# Patient Record
Sex: Female | Born: 1975
Health system: Southern US, Community
[De-identification: ages and names within clinical notes are randomized; demographics above are authoritative.]

## PROBLEM LIST (undated history)

## (undated) DIAGNOSIS — Q211 Atrial septal defect, unspecified: Secondary | ICD-10-CM

## (undated) DIAGNOSIS — F329 Major depressive disorder, single episode, unspecified: Secondary | ICD-10-CM

## (undated) DIAGNOSIS — D219 Benign neoplasm of connective and other soft tissue, unspecified: Secondary | ICD-10-CM

## (undated) DIAGNOSIS — E7800 Pure hypercholesterolemia, unspecified: Secondary | ICD-10-CM

## (undated) DIAGNOSIS — F419 Anxiety disorder, unspecified: Secondary | ICD-10-CM

## (undated) DIAGNOSIS — F32A Depression, unspecified: Secondary | ICD-10-CM

## (undated) DIAGNOSIS — G43109 Migraine with aura, not intractable, without status migrainosus: Secondary | ICD-10-CM

## (undated) DIAGNOSIS — G459 Transient cerebral ischemic attack, unspecified: Secondary | ICD-10-CM

## (undated) HISTORY — DX: Migraine with aura, not intractable, without status migrainosus: G43.109

## (undated) HISTORY — DX: Anxiety disorder, unspecified: F41.9

## (undated) HISTORY — PX: WISDOM TOOTH EXTRACTION: SHX21

## (undated) HISTORY — DX: Atrial septal defect: Q21.1

## (undated) HISTORY — DX: Transient cerebral ischemic attack, unspecified: G45.9

## (undated) HISTORY — DX: Atrial septal defect, unspecified: Q21.10

## (undated) HISTORY — PX: ASD REPAIR: SHX258

---
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
## 1999-04-02 HISTORY — PX: OTHER SURGICAL HISTORY: SHX169

## 2001-07-13 ENCOUNTER — Other Ambulatory Visit: Admission: RE | Admit: 2001-07-13 | Discharge: 2001-07-13 | Payer: Self-pay | Admitting: Obstetrics & Gynecology

## 2002-08-23 ENCOUNTER — Other Ambulatory Visit: Admission: RE | Admit: 2002-08-23 | Discharge: 2002-08-23 | Payer: Self-pay | Admitting: Obstetrics & Gynecology

## 2008-04-01 HISTORY — PX: OTHER SURGICAL HISTORY: SHX169

## 2008-06-30 DIAGNOSIS — G459 Transient cerebral ischemic attack, unspecified: Secondary | ICD-10-CM

## 2008-06-30 HISTORY — DX: Transient cerebral ischemic attack, unspecified: G45.9

## 2009-06-02 ENCOUNTER — Emergency Department (HOSPITAL_COMMUNITY): Admission: EM | Admit: 2009-06-02 | Discharge: 2009-06-03 | Payer: Self-pay | Admitting: Emergency Medicine

## 2011-01-21 ENCOUNTER — Ambulatory Visit (INDEPENDENT_AMBULATORY_CARE_PROVIDER_SITE_OTHER): Payer: BC Managed Care – PPO

## 2011-01-21 ENCOUNTER — Inpatient Hospital Stay (INDEPENDENT_AMBULATORY_CARE_PROVIDER_SITE_OTHER)
Admission: RE | Admit: 2011-01-21 | Discharge: 2011-01-21 | Disposition: A | Discharge status: Home / Self Care | Payer: BC Managed Care – PPO | Source: Ambulatory Visit | Attending: Emergency Medicine | Admitting: Emergency Medicine

## 2011-01-21 DIAGNOSIS — J069 Acute upper respiratory infection, unspecified: Secondary | ICD-10-CM

## 2014-04-01 HISTORY — PX: OTHER SURGICAL HISTORY: SHX169

## 2014-05-31 ENCOUNTER — Encounter (HOSPITAL_COMMUNITY): Payer: Self-pay | Admitting: Emergency Medicine

## 2014-05-31 ENCOUNTER — Emergency Department (HOSPITAL_COMMUNITY)
Admission: EM | Admit: 2014-05-31 | Discharge: 2014-05-31 | Disposition: A | Discharge status: Home / Self Care | Payer: BLUE CROSS/BLUE SHIELD | Attending: Emergency Medicine | Admitting: Emergency Medicine

## 2014-05-31 ENCOUNTER — Emergency Department (HOSPITAL_COMMUNITY): Payer: BLUE CROSS/BLUE SHIELD

## 2014-05-31 DIAGNOSIS — R42 Dizziness and giddiness: Secondary | ICD-10-CM | POA: Insufficient documentation

## 2014-05-31 DIAGNOSIS — R079 Chest pain, unspecified: Secondary | ICD-10-CM

## 2014-05-31 LAB — BASIC METABOLIC PANEL
Anion gap: 10 (ref 5–15)
BUN: 16 mg/dL (ref 6–23)
CO2: 24 mmol/L (ref 19–32)
Calcium: 9.4 mg/dL (ref 8.4–10.5)
Chloride: 104 mmol/L (ref 96–112)
Creatinine, Ser: 0.63 mg/dL (ref 0.50–1.10)
GFR calc Af Amer: >90 mL/min (ref >90)
GFR calc non Af Amer: >90 mL/min (ref >90)
Glucose, Bld: 90 mg/dL (ref 70–99)
Potassium: 4.1 mmol/L (ref 3.5–5.1)
Sodium: 138 mmol/L (ref 135–145)

## 2014-05-31 LAB — CBC
HCT: 40.8 % (ref 36.0–46.0)
Hemoglobin: 13.2 g/dL (ref 12.0–15.0)
MCH: 29.4 pg (ref 26.0–34.0)
MCHC: 32.4 g/dL (ref 30.0–36.0)
MCV: 90.9 fL (ref 78.0–100.0)
Platelets: 169 10*3/uL (ref 150–400)
RBC: 4.49 MIL/uL (ref 3.87–5.11)
RDW: 13.3 % (ref 11.5–15.5)
WBC: 6.7 10*3/uL (ref 4.0–10.5)

## 2014-05-31 LAB — I-STAT TROPONIN, ED: Troponin i, poc: 0 ng/mL (ref 0.00–0.08)

## 2014-05-31 LAB — D-DIMER, QUANTITATIVE: D-Dimer, Quant: 0.41 ug/mL-FEU (ref 0.00–0.48)

## 2014-05-31 NOTE — ED Notes (Addendum)
Pt has a blue top and a gold top draw/held in the main lab.

## 2014-05-31 NOTE — Discharge Instructions (Signed)

## 2014-05-31 NOTE — ED Notes (Addendum)
Pt c/o chest pain left side of sternum that started today. Pt has some lightheadedness.  Pt has PMH of TIA and had Helix device implanted.. Pt took ASA 325mg  this morning.

## 2014-05-31 NOTE — ED Provider Notes (Signed)
CSN: [id number]     Arrival date & time 05/31/14  1323 History   First MD Initiated Contact with Patient 05/31/14 1519     Chief Complaint  Patient presents with  . Chest Pain    Patient is a 39 y.o. female presenting with chest pain. The history is provided by the patient. No language interpreter was used.  Chest Pain  Ms. Wolfley presents for the evaluation of chest pain. She reports chest tightness that started at 9:30 this morning. It was constant until 1:00 pm today. The pain was in the central chest and a little bit onto the left side of the chest. There were no alleviating or worsening factors associated with this pain. She did feel a little "fuzzy headed" at the time. She denies any diaphoresis, shortness of breath, cough, abdominal pain, nausea, vomiting, leg swelling or pain. She has a history of TIA and Helex device placement. She's had stripping of varicose veins a year ago.  History reviewed. No pertinent past medical history. Past Surgical History  Procedure Laterality Date  . Tonsillectomy    . Helix device     No family history on file. History  Substance Use Topics  . Smoking status: Never Smoker   . Smokeless tobacco: Not on file  . Alcohol Use: Yes     Comment: occasional   OB History    No data available     Review of Systems  Cardiovascular: Positive for chest pain.  All other systems reviewed and are negative.     Allergies  Review of patient's allergies indicates no known allergies.  Home Medications   Prior to Admission medications   Not on File   BP 118/82 mmHg  Pulse 84  Temp(Src) 98.1 F (36.7 C) (Oral)  Resp 17  Ht 5\' 10"  (1.778 m)  Wt 200 lb (90.719 kg)  BMI 28.70 kg/m2  SpO2 100%  LMP 05/27/2014 Physical Exam  Constitutional: She is oriented to person, place, and time. She appears well-developed and well-nourished.  HENT:  Head: Normocephalic and atraumatic.  Cardiovascular: Normal rate and regular rhythm.   No murmur  heard. Pulmonary/Chest: Effort normal and breath sounds normal. No respiratory distress.  Abdominal: Soft. There is no tenderness. There is no rebound and no guarding.  Musculoskeletal: She exhibits no edema or tenderness.  Neurological: She is alert and oriented to person, place, and time.  Skin: Skin is warm and dry.  Psychiatric: She has a normal mood and affect. Her behavior is normal.  Nursing note and vitals reviewed.   ED Course  Procedures (including critical care time) Labs Review Labs Reviewed  CBC  BASIC METABOLIC PANEL  D-DIMER, QUANTITATIVE  I-STAT [place], ED    Imaging Review Dg Chest 2 View  05/31/2014   CLINICAL DATA:  Central chest pain since 0930. Hypertension. Nonsmoker.  EXAM: CHEST  2 VIEW  COMPARISON:  01/21/2011  FINDINGS: Midline trachea.  Normal heart size and mediastinal contours.  Sharp costophrenic angles.  No pneumothorax.  Clear lungs.  Note is made of a septal occlusion device.  IMPRESSION: No active cardiopulmonary disease.   Electronically Signed   By: Abigail Miyamoto M.D.   On: 05/31/2014 14:48     EKG Interpretation   Date/Time:  Tuesday May 31 2014 13:29:35 EST Ventricular Rate:  83 PR Interval:  163 QRS Duration: 87 QT Interval:  386 QTC Calculation: 453 R Axis:   62 Text Interpretation:  Sinus rhythm Borderline T abnormalities, anterior  leads No prior  for comparison Confirmed by DOCHERTY  MD, Oretta [phone]) on  05/31/2014 1:32:39 PM      MDM   Final diagnoses:  Chest pain, unspecified chest pain type    Patient here for evaluation of chest pain. History and presentation is atypical for ACS. Patient is low-risk for PE and d-dimer is negative, CT is not warranted at this time. Discussed with patient home care for chest pain that is likely related to anxiety. Discussed PCP follow-up as well as return precautions.   Quintella Reichert, MD 05/31/14 [phone]

## 2014-08-09 ENCOUNTER — Encounter: Payer: Self-pay | Admitting: *Deleted

## 2014-08-17 ENCOUNTER — Encounter: Payer: Self-pay | Admitting: Neurology

## 2014-08-17 ENCOUNTER — Ambulatory Visit (INDEPENDENT_AMBULATORY_CARE_PROVIDER_SITE_OTHER): Payer: BLUE CROSS/BLUE SHIELD | Admitting: Neurology

## 2014-08-17 VITALS — BP 106/70 | HR 80 | Resp 20 | Ht 69.5 in | Wt 197.7 lb

## 2014-08-17 DIAGNOSIS — Z9889 Other specified postprocedural states: Secondary | ICD-10-CM

## 2014-08-17 DIAGNOSIS — Z8673 Personal history of transient ischemic attack (TIA), and cerebral infarction without residual deficits: Secondary | ICD-10-CM | POA: Diagnosis not present

## 2014-08-17 DIAGNOSIS — R9082 White matter disease, unspecified: Secondary | ICD-10-CM

## 2014-08-17 DIAGNOSIS — R930 Abnormal findings on diagnostic imaging of skull and head, not elsewhere classified: Secondary | ICD-10-CM

## 2014-08-17 DIAGNOSIS — G43809 Other migraine, not intractable, without status migrainosus: Secondary | ICD-10-CM | POA: Diagnosis not present

## 2014-08-17 DIAGNOSIS — Z8774 Personal history of (corrected) congenital malformations of heart and circulatory system: Secondary | ICD-10-CM | POA: Insufficient documentation

## 2014-08-17 DIAGNOSIS — G43109 Migraine with aura, not intractable, without status migrainosus: Secondary | ICD-10-CM | POA: Insufficient documentation

## 2014-08-17 NOTE — Patient Instructions (Signed)
At this point, I wouldn't make any changes.  It is not unusual for people who had migraines many years ago to develop them again years later.  They can also present differently than their previous migraines.  Your exam is normal and if things are at baseline, I wouldn't make any changes.  The finding on the MRI of the brain is likely not clinically relevant.  Follow up in one year or as needed.

## 2014-08-17 NOTE — Progress Notes (Signed)
NEUROLOGY CONSULTATION NOTE  Angie Martin MRN: [id number] DOB: 02-08-1976  Referring provider: Lester Kinsman, PA Primary care provider: none  Reason for consult:  Ocular migraine and history of TIA  HISTORY OF PRESENT ILLNESS: Angie Martin) Farrington is a 39 year old right-handed woman with anxiety and history of TIA who presents for ocular migraine.  Records, MRIs of brain from 2010 and 2011 and labs reviewed.  She was admitted to [facility] in March 2010 for episode of bilateral vision loss lasting about 15 minutes followed by feeling of disequilibrium and diaphoresis.  There was no associated headache or focal numbness or weakness.  MRI of the brain revealed small subcortical signal abnormality adjacent to the lateral ventricle.  She was found to have an atrial septal defect.  It was closed with helex device in April 2010.  She was told to take ASA 325mg  daily for one year.  She was given a diagnosis of TIA.  Following this event, she began to have episodes of visual disturbance.  These episodes consist of flashing lights and tunnel vision in both eyes.  They last 15-20 minutes.  There is no associated headache.  She had a normal eye exam.  They typically occur once a month, however she had frequent spells over the course of one week about 6 weeks ago.    She does have a history of migraines, consisting of headache, nausea and vomiting, which resolved after high school.  She never had auras before.  Family history on her mother's side include migraine.  Her maternal grandmother had ALS.  Her maternal great-grandmother had Alzheimer's dementia.  Cholesterol 253, HDL 7 and LDL 174.    In the electronic medical records, she had an MRI of the brain on 06/03/09 for evaluation of blurred vision, which showed FLAIR and T2 white matter foci adjacent to the atrial of the lateral ventricles, as would usually be seen with perinatal insult.    PAST MEDICAL HISTORY: Past Medical History  Diagnosis Date  .  TIA (transient ischemic attack)   . Ocular migraine   . ASD (atrial septal defect)   . Anxiety     PAST SURGICAL HISTORY: Past Surgical History  Procedure Laterality Date  . Tonsillectomy    . Helix device    . Vein laser surgery    . Asd repair      MEDICATIONS: Current Outpatient Prescriptions on File Prior to Visit  Medication Sig Dispense Refill  . ALPRAZolam (XANAX) 0.5 MG tablet Take 0.5 mg by mouth daily as needed for anxiety.    Marland Kitchen aspirin EC 325 MG tablet Take 325 mg by mouth once.    Marland Kitchen OVER THE COUNTER MEDICATION Take 1 tablet by mouth daily. Dietary supplement. Not sure of name.     No current facility-administered medications on file prior to visit.    ALLERGIES: No Known Allergies  FAMILY HISTORY: Family History  Problem Relation Age of Onset  . Hypertension Mother   . Coronary artery disease Mother   . Coronary artery disease Maternal Grandmother   . Hypertension Maternal Grandmother   . ALS Maternal Grandmother     SOCIAL HISTORY: History   Social History  . Marital Status: Single    Spouse Name: N/A  . Number of Children: N/A  . Years of Education: N/A   Occupational History  . Not on file.   Social History Main Topics  . Smoking status: Never Smoker   . Smokeless tobacco: Not on file  . Alcohol  Use: 0.0 oz/week    0 Standard drinks or equivalent per week     Comment: occasional  . Drug Use: No  . Sexual Activity:    Partners: Female   Other Topics Concern  . Not on file   Social History Narrative   Single.  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  No children.    REVIEW OF SYSTEMS: Constitutional: No fevers, chills, or sweats, no generalized fatigue, change in appetite Eyes: No visual changes, double vision, eye pain Ear, nose and throat: No hearing loss, ear pain, nasal congestion, sore throat Cardiovascular: No chest pain, palpitations Respiratory:  No shortness of breath at rest or with exertion, wheezes GastrointestinaI: No nausea, vomiting,  diarrhea, abdominal pain, fecal incontinence Genitourinary:  No dysuria, urinary retention or frequency Musculoskeletal:  No neck pain, back pain Integumentary: No rash, pruritus, skin lesions Neurological: as above Psychiatric: No depression, insomnia, anxiety Endocrine: No palpitations, fatigue, diaphoresis, mood swings, change in appetite, change in weight, increased thirst Hematologic/Lymphatic:  No anemia, purpura, petechiae. Allergic/Immunologic: no itchy/runny eyes, nasal congestion, recent allergic reactions, rashes  PHYSICAL EXAM: Filed Vitals:   08/17/14 0755  BP: 106/70  Pulse: 80  Resp: 20   General: No acute distress Head:  Normocephalic/atraumatic Eyes:  fundi unremarkable, without vessel changes, exudates, hemorrhages or papilledema. Neck: supple, no paraspinal tenderness, full range of motion Back: No paraspinal tenderness Heart: regular rate and rhythm Lungs: Clear to auscultation bilaterally. Vascular: No carotid bruits. Neurological Exam: Mental status: alert and oriented to person, place, and time, recent and remote memory intact, fund of knowledge intact, attention and concentration intact, speech fluent and not dysarthric, language intact. Cranial nerves: CN I: not tested CN II: pupils equal, round and reactive to light, visual fields intact, fundi unremarkable, without vessel changes, exudates, hemorrhages or papilledema. CN III, IV, VI:  full range of motion, no nystagmus, no ptosis CN V: facial sensation intact CN VII: upper and lower face symmetric CN VIII: hearing intact CN IX, X: gag intact, uvula midline CN XI: sternocleidomastoid and trapezius muscles intact CN XII: tongue midline Bulk & Tone: normal, no fasciculations. Motor:  5/5 throughout Sensation:  Pinprick and vibration intact Deep Tendon Reflexes:  2+ throughout, toes downgoing Finger to nose testing:  No dysmetria Heel to shin:  No dysmetria Gait:  Normal station and stride.  Able to  turn and walk in tandem. Romberg negative.  IMPRESSION: 1.  Ocular migraines.  Since they are not debilitation, without headache and occur only once a month, no management needed. 2.  Abnormal MRI of brain.  White matter abnormality likely not clinically relevant.  As per radiologist, finding is consistent with a perinatal insult.   3.  History of TIA.  This is a diagnosis of exclusion.  If this was a TIA, then she presumably had a embolic event affecting both occipital lobes, which is unusual, especially in absence of other focal symptoms.  This, in fact, may have been migraine as well. 4.  History of atrial septal defect closure  PLAN: No new management or workup warranted at this time.  Follow up in one year or as needed.  Thank you for allowing me to take part in the care of this patient.  Metta Clines, DO  CC:  Lester Kinsman, [place]

## 2015-06-12 ENCOUNTER — Encounter (HOSPITAL_COMMUNITY): Payer: Self-pay | Admitting: Emergency Medicine

## 2015-06-12 ENCOUNTER — Emergency Department (HOSPITAL_COMMUNITY): Payer: BLUE CROSS/BLUE SHIELD

## 2015-06-12 ENCOUNTER — Emergency Department (HOSPITAL_COMMUNITY)
Admission: EM | Admit: 2015-06-12 | Discharge: 2015-06-12 | Discharge status: Against Medical Advice | Payer: BLUE CROSS/BLUE SHIELD | Attending: Emergency Medicine | Admitting: Emergency Medicine

## 2015-06-12 DIAGNOSIS — Z7982 Long term (current) use of aspirin: Secondary | ICD-10-CM | POA: Insufficient documentation

## 2015-06-12 DIAGNOSIS — Z8679 Personal history of other diseases of the circulatory system: Secondary | ICD-10-CM | POA: Diagnosis not present

## 2015-06-12 DIAGNOSIS — Z9889 Other specified postprocedural states: Secondary | ICD-10-CM | POA: Insufficient documentation

## 2015-06-12 DIAGNOSIS — Z8774 Personal history of (corrected) congenital malformations of heart and circulatory system: Secondary | ICD-10-CM | POA: Diagnosis not present

## 2015-06-12 DIAGNOSIS — Z8639 Personal history of other endocrine, nutritional and metabolic disease: Secondary | ICD-10-CM | POA: Diagnosis not present

## 2015-06-12 DIAGNOSIS — Z8673 Personal history of transient ischemic attack (TIA), and cerebral infarction without residual deficits: Secondary | ICD-10-CM | POA: Diagnosis not present

## 2015-06-12 DIAGNOSIS — F419 Anxiety disorder, unspecified: Secondary | ICD-10-CM | POA: Diagnosis not present

## 2015-06-12 DIAGNOSIS — R079 Chest pain, unspecified: Secondary | ICD-10-CM | POA: Diagnosis present

## 2015-06-12 DIAGNOSIS — Z3202 Encounter for pregnancy test, result negative: Secondary | ICD-10-CM | POA: Diagnosis not present

## 2015-06-12 DIAGNOSIS — Z79899 Other long term (current) drug therapy: Secondary | ICD-10-CM | POA: Diagnosis not present

## 2015-06-12 DIAGNOSIS — R0789 Other chest pain: Secondary | ICD-10-CM | POA: Diagnosis not present

## 2015-06-12 HISTORY — DX: Pure hypercholesterolemia, unspecified: E78.00

## 2015-06-12 LAB — D-DIMER, QUANTITATIVE: D-Dimer, Quant: 0.33 ug/mL-FEU (ref 0.00–0.50)

## 2015-06-12 LAB — I-STAT BETA HCG BLOOD, ED (MC, WL, AP ONLY): I-stat hCG, quantitative: <5 m[IU]/mL (ref <5)

## 2015-06-12 LAB — BASIC METABOLIC PANEL
Anion gap: 9 (ref 5–15)
BUN: 13 mg/dL (ref 6–20)
CO2: 24 mmol/L (ref 22–32)
Calcium: 9 mg/dL (ref 8.9–10.3)
Chloride: 106 mmol/L (ref 101–111)
Creatinine, Ser: 0.57 mg/dL (ref 0.44–1.00)
GFR calc Af Amer: >60 mL/min (ref >60)
GFR calc non Af Amer: >60 mL/min (ref >60)
Glucose, Bld: 93 mg/dL (ref 65–99)
Potassium: 3.8 mmol/L (ref 3.5–5.1)
Sodium: 139 mmol/L (ref 135–145)

## 2015-06-12 LAB — I-STAT TROPONIN, ED
Troponin i, poc: 0 ng/mL (ref 0.00–0.08)
Troponin i, poc: 0 ng/mL (ref 0.00–0.08)

## 2015-06-12 LAB — CBC
HCT: 37.6 % (ref 36.0–46.0)
Hemoglobin: 12.1 g/dL (ref 12.0–15.0)
MCH: 29.4 pg (ref 26.0–34.0)
MCHC: 32.2 g/dL (ref 30.0–36.0)
MCV: 91.3 fL (ref 78.0–100.0)
Platelets: 160 10*3/uL (ref 150–400)
RBC: 4.12 MIL/uL (ref 3.87–5.11)
RDW: 13.8 % (ref 11.5–15.5)
WBC: 8.2 10*3/uL (ref 4.0–10.5)

## 2015-06-12 MED ORDER — NITROGLYCERIN 0.4 MG SL SUBL
0.4000 mg | SUBLINGUAL_TABLET | SUBLINGUAL | Status: DC | PRN
  Administered 2015-06-12: 0.4 mg via SUBLINGUAL
  Filled 2015-06-12: qty 1

## 2015-06-12 MED ORDER — SODIUM CHLORIDE 0.9 % IV BOLUS (SEPSIS)
1000.0000 mL | Freq: Once | INTRAVENOUS | Status: AC
Start: 2015-06-12 — End: 2015-06-12
  Administered 2015-06-12: 1000 mL via INTRAVENOUS

## 2015-06-12 NOTE — ED Notes (Signed)
IV team at bedside 

## 2015-06-12 NOTE — ED Notes (Signed)
Unable to get labs time 2 tries

## 2015-06-12 NOTE — Discharge Instructions (Signed)
Start taking a daily low-dose 81mg  aspirin  Your hypercoagulability studies are still pending your primary care physician or OB/GYN will have to follow-up these results. They must call to request results.  Please call cardiology to schedule a stress test. Do not hesitate to return to the emergency room for any new, worsening or concerning symptoms.   Nonspecific Chest Pain  Chest pain can be caused by many different conditions. There is always a chance that your pain could be related to something serious, such as a heart attack or a blood clot in your lungs. Chest pain can also be caused by conditions that are not life-threatening. If you have chest pain, it is very important to follow up with your health care provider. CAUSES  Chest pain can be caused by:  Heartburn.  Pneumonia or bronchitis.  Anxiety or stress.  Inflammation around your heart (pericarditis) or lung (pleuritis or pleurisy).  A blood clot in your lung.  A collapsed lung (pneumothorax). It can develop suddenly on its own (spontaneous pneumothorax) or from trauma to the chest.  Shingles infection (varicella-zoster virus).  Heart attack.  Damage to the bones, muscles, and cartilage that make up your chest wall. This can include:  Bruised bones due to injury.  Strained muscles or cartilage due to frequent or repeated coughing or overwork.  Fracture to one or more ribs.  Sore cartilage due to inflammation (costochondritis). RISK FACTORS  Risk factors for chest pain may include:  Activities that increase your risk for trauma or injury to your chest.  Respiratory infections or conditions that cause frequent coughing.  Medical conditions or overeating that can cause heartburn.  Heart disease or family history of heart disease.  Conditions or health behaviors that increase your risk of developing a blood clot.  Having had chicken pox (varicella zoster). SIGNS AND SYMPTOMS Chest pain can feel  like:  Burning or tingling on the surface of your chest or deep in your chest.  Crushing, pressure, aching, or squeezing pain.  Dull or sharp pain that is worse when you move, cough, or take a deep breath.  Pain that is also felt in your back, neck, shoulder, or arm, or pain that spreads to any of these areas. Your chest pain may come and go, or it may stay constant. DIAGNOSIS Lab tests or other studies may be needed to find the cause of your pain. Your health care provider may have you take a test called an ambulatory ECG (electrocardiogram). An ECG records your heartbeat patterns at the time the test is performed. You may also have other tests, such as:  Transthoracic echocardiogram (TTE). During echocardiography, sound waves are used to create a picture of all of the heart structures and to look at how blood flows through your heart.  Transesophageal echocardiogram (TEE).This is a more advanced imaging test that obtains images from inside your body. It allows your health care provider to see your heart in finer detail.  Cardiac monitoring. This allows your health care provider to monitor your heart rate and rhythm in real time.  Holter monitor. This is a portable device that records your heartbeat and can help to diagnose abnormal heartbeats. It allows your health care provider to track your heart activity for several days, if needed.  Stress tests. These can be done through exercise or by taking medicine that makes your heart beat more quickly.  Blood tests.  Imaging tests. TREATMENT  Your treatment depends on what is causing your chest pain. Treatment may  include:  Medicines. These may include:  Acid blockers for heartburn.  Anti-inflammatory medicine.  Pain medicine for inflammatory conditions.  Antibiotic medicine, if an infection is present.  Medicines to dissolve blood clots.  Medicines to treat coronary artery disease.  Supportive care for conditions that do not  require medicines. This may include:  Resting.  Applying heat or cold packs to injured areas.  Limiting activities until pain decreases. HOME CARE INSTRUCTIONS  If you were prescribed an antibiotic medicine, finish it all even if you start to feel better.  Avoid any activities that bring on chest pain.  Do not use any tobacco products, including cigarettes, chewing tobacco, or electronic cigarettes. If you need help quitting, ask your health care provider.  Do not drink alcohol.  Take medicines only as directed by your health care provider.  Keep all follow-up visits as directed by your health care provider. This is important. This includes any further testing if your chest pain does not go away.  If heartburn is the cause for your chest pain, you may be told to keep your head raised (elevated) while sleeping. This reduces the chance that acid will go from your stomach into your esophagus.  Make lifestyle changes as directed by your health care provider. These may include:  Getting regular exercise. Ask your health care provider to suggest some activities that are safe for you.  Eating a heart-healthy diet. A registered dietitian can help you to learn healthy eating options.  Maintaining a healthy weight.  Managing diabetes, if necessary.  Reducing stress. SEEK MEDICAL CARE IF:  Your chest pain does not go away after treatment.  You have a rash with blisters on your chest.  You have a fever. SEEK IMMEDIATE MEDICAL CARE IF:   Your chest pain is worse.  You have an increasing cough, or you cough up blood.  You have severe abdominal pain.  You have severe weakness.  You faint.  You have chills.  You have sudden, unexplained chest discomfort.  You have sudden, unexplained discomfort in your arms, back, neck, or jaw.  You have shortness of breath at any time.  You suddenly start to sweat, or your skin gets clammy.  You feel nauseous or you vomit.  You  suddenly feel light-headed or dizzy.  Your heart begins to beat quickly, or it feels like it is skipping beats. These symptoms may represent a serious problem that is an emergency. Do not wait to see if the symptoms will go away. Get medical help right away. Call your local emergency services (911 in the [place] Do not drive yourself to the hospital.   This information is not intended to replace advice given to you by your health care provider. Make sure you discuss any questions you have with your health care provider.   Document Released: 12/26/2004 Document Revised: 04/08/2014 Document Reviewed: 10/22/2013 Elsevier Interactive Patient Education [employer].

## 2015-06-12 NOTE — ED Provider Notes (Signed)
CSN: [id number]     Arrival date & time 06/12/15  [id number] History   First MD Initiated Contact with Patient 06/12/15 1148     Chief Complaint  Patient presents with  . Chest Pain     (Consider location/radiation/quality/duration/timing/severity/associated sxs/prior Treatment) HPI   Blood pressure 130/82, pulse 87, temperature 98.1 F (36.7 C), temperature source Oral, resp. rate 14, height 5\' 10"  (1.778 m), weight 95.255 kg, last menstrual period 05/29/2015, SpO2 100 %.  Angie Martin is a 40 y.o. female with past medical history significant for hyperlipidemia, ASD, TIA and anxiety complaining of retrosternal chest pain described as sharp and pressure-like onset 3 days ago radiating to her back, rated at 8 out of 10 no exacerbating or alleviating factors identified. Patient denies shortness of breath, cough above her baseline (she's had a sinus infection last week) fever, chills, history of DVT/PE, recent mobilizations however, patient recently returned from charts and take those, she had a 3 hour flight last week. She denies calf pain, leg swelling. Patient took a full dose aspirin this morning. She hasn't tried any pain medication for chest pain. 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 She has a family history of CAD with mother dying in her sleep at age 41 from massive heart attack. Patient has never been a smoker, she denies diabetes, hypertension. She is not treated for her hyperlipidemia but is trying to control it with diet. She denies suicidal ideation, homicidal ideation, auditory or visual hallucinations, alcohol or drug use. She took a Xanax prior to arrival with some relief. She states that she is having coagulability issues with abnormally heavy menses and irregular menses onset approximately one month ago. She denies syncope, abdominal pain,   Past Medical History  Diagnosis Date  .  TIA (transient ischemic attack)   . Ocular migraine   . ASD (atrial septal defect)   . Anxiety   . High cholesterol    Past Surgical History  Procedure Laterality Date  . Tonsillectomy    . Helix device    . Vein laser surgery    . Asd repair     Family History  Problem Relation Age of Onset  . Hypertension Mother   . Coronary artery disease Mother   . Coronary artery disease Maternal Grandmother   . Hypertension Maternal Grandmother   . ALS Maternal Grandmother    Social History  Substance Use Topics  . Smoking status: Never Smoker   . Smokeless tobacco: None  . Alcohol Use: 0.0 oz/week    0 Standard drinks or equivalent per week     Comment: occasional   OB History    No data available     Review of Systems  10 systems reviewed and found to be negative, except as noted in the HPI.   Allergies  Review of patient's allergies indicates no known allergies.  Home Medications   Prior to Admission medications   Medication Sig Start Date End Date Taking? Authorizing Provider  ALPRAZolam Duanne Moron) 1 MG tablet Take 1 mg by mouth 2 (two) times daily as needed for anxiety.   Yes Historical Provider, MD  aspirin EC 325 MG tablet Take 325 mg by mouth once.   Yes Historical Provider, MD  cetirizine (ZYRTEC) 10 MG tablet Take 10 mg by mouth daily as needed for allergies or rhinitis.   Yes Historical Provider, MD  OVER THE COUNTER  MEDICATION Take 2 capsules by mouth at bedtime as needed (for sleep). *[place] - sleep supplement*   Yes Historical Provider, MD   BP 106/63 mmHg  Pulse 82  Temp(Src) 98.1 F (36.7 C) (Oral)  Resp 16  Ht 5\' 10"  (1.778 m)  Wt 95.255 kg  BMI 30.13 kg/m2  SpO2 96%  LMP 05/29/2015 Physical Exam  Constitutional: She is oriented to person, place, and time. She appears well-developed and well-nourished. No distress.  HENT:  Head: Normocephalic.  Mouth/Throat: Oropharynx is clear and moist.  Eyes: Conjunctivae are normal.  Neck: Normal range of  motion. No JVD present. No tracheal deviation present.  Cardiovascular: Normal rate, regular rhythm and intact distal pulses.   Radial pulse equal bilaterally  Pulmonary/Chest: Effort normal and breath sounds normal. No stridor. No respiratory distress. She has no wheezes. She has no rales. She exhibits no tenderness.  Abdominal: Soft. She exhibits no distension and no mass. There is no tenderness. There is no rebound and no guarding.  Musculoskeletal: Normal range of motion. She exhibits no edema or tenderness.  No calf asymmetry, superficial collaterals, palpable cords, edema, Homans sign negative bilaterally.    Neurological: She is alert and oriented to person, place, and time.  Skin: Skin is warm. She is not diaphoretic.  Psychiatric: She has a normal mood and affect.  Nursing note and vitals reviewed.   ED Course  Procedures (including critical care time) Labs Review Labs Reviewed  BASIC METABOLIC PANEL  CBC  D-DIMER, QUANTITATIVE (NOT AT [facility])  ANTITHROMBIN III  PROTEIN C ACTIVITY  PROTEIN C, TOTAL  PROTEIN S ACTIVITY  PROTEIN S, TOTAL  LUPUS ANTICOAGULANT PANEL  BETA-2-GLYCOPROTEIN I ABS, IGG/M/A  HOMOCYSTEINE  FACTOR 5 LEIDEN  PROTHROMBIN GENE MUTATION  CARDIOLIPIN ANTIBODIES, IGG, IGM, IGA  I-STAT TROPOININ, ED  I-STAT BETA HCG BLOOD, ED ([facility], [facility], [facility] ONLY)  I-STAT TROPOININ, ED    Imaging Review Dg Chest 2 View  06/12/2015  CLINICAL DATA:  Mid chest pain and tightness beginning the morning of 06/10/2015. EXAM: CHEST  2 VIEW COMPARISON:  PA and lateral chest 05/31/2014 and 01/21/2011. FINDINGS: The lungs are clear. Heart size is normal. No pneumothorax or pleural effusion. Septal occlusion device is again seen, unchanged. IMPRESSION: No acute disease. Electronically Signed   By: Inge Rise M.D.   On: 06/12/2015 11:32   I have personally reviewed and evaluated these images and lab results as part of my medical decision-making.   EKG  Interpretation   Date/Time:  Monday June 12 2015 10:22:03 EDT Ventricular Rate:  82 PR Interval:  177 QRS Duration: 99 QT Interval:  395 QTC Calculation: 461 R Axis:   55 Text Interpretation:  Sinus rhythm Anteroseptal infarct, age indeterminate  Baseline wander in lead(s) V6 No significant change since last tracing  Confirmed by ALLEN  MD, ANTHONY ([id number]) on 06/12/2015 2:11:10 PM      MDM   Final diagnoses:  Atypical chest pain   Filed Vitals:   06/12/15 1335 06/12/15 1337 06/12/15 1353 06/12/15 1415  BP: 110/61 110/61 111/59 106/63  Pulse: 90 85 93 82  Temp:      TempSrc:      Resp:  16    Height:      Weight:      SpO2: 100% 100% 99% 96%    Medications  nitroGLYCERIN (NITROSTAT) SL tablet 0.4 mg (0.4 mg Sublingual Given 06/12/15 1349)  sodium chloride 0.9 % bolus 1,000 mL (1,000 mLs Intravenous New Bag/Given 06/12/15 1352)  Angie Martin is 40 y.o. female presenting with Atypical chest pain onset 2 days ago constant, no exacerbating or alleviating factors. EKG with no ischemic changes or arrhythmia. Chest x-ray negative, troponin negative. Physical exam is not consistent with DVT however patient did have a 3 hour flight last week. I think this may be anxiety related however, she is also complaining of abnormally heavy menses with clots. We'll draw hypercoagulability panel and she understands her primary care or OB/GYN must follow this up. We'll also draw d-dimer.  Dimer negative. Will delta troponin her. Given her family history of mother dying at 102 cardiology referral given for possible stress test.  Patient had to leave before second troponin resulted. Delta troponin negative. Asked nurse to call the patient to give cardiology contact information.      Monico Blitz, PA-C 06/12/15 1636  Lacretia Leigh, MD 06/14/15 1436

## 2015-06-12 NOTE — ED Notes (Signed)
Phlebotomy notified to draw labs as pt is difficult stick & per pt request.

## 2015-06-12 NOTE — ED Notes (Signed)
Patient's istat troponin draw and processed at 1625. Angie Martin, [place] notified. Patient apologizes but states she must leave in order to make another appointment. Requests we call her with the results but understands she is leaving AMA.

## 2015-06-12 NOTE — ED Notes (Signed)
Hx of helix device placed in heart in 2010. Two days ago began having central sharp chest pain and back pain to left upper shoulder area, denies SOB, N/V/D. "I might be having a panic attack, I really don't know. I've been having a lot of huge stressors in my life and am feeling kind of overwhelmed."

## 2015-06-13 ENCOUNTER — Other Ambulatory Visit: Payer: Self-pay | Admitting: Family Medicine

## 2015-06-13 ENCOUNTER — Ambulatory Visit
Admission: RE | Admit: 2015-06-13 | Discharge: 2015-06-13 | Disposition: A | Discharge status: Home / Self Care | Payer: BLUE CROSS/BLUE SHIELD | Source: Ambulatory Visit | Attending: Family Medicine | Admitting: Family Medicine

## 2015-06-13 DIAGNOSIS — R0602 Shortness of breath: Secondary | ICD-10-CM

## 2015-06-13 DIAGNOSIS — R079 Chest pain, unspecified: Secondary | ICD-10-CM

## 2015-06-13 LAB — HOMOCYSTEINE: Homocysteine: 9.8 umol/L (ref 0.0–15.0)

## 2015-06-13 MED ORDER — IOPAMIDOL (ISOVUE-370) INJECTION 76%
100.0000 mL | Freq: Once | INTRAVENOUS | Status: AC | PRN
  Administered 2015-06-13: 100 mL via INTRAVENOUS

## 2015-06-14 ENCOUNTER — Telehealth: Payer: Self-pay | Admitting: Internal Medicine

## 2015-06-14 LAB — BETA-2-GLYCOPROTEIN I ABS, IGG/M/A
Beta-2 Glyco I IgG: <9 GPI IgG units (ref 0–20)
Beta-2-Glycoprotein I IgA: <9 GPI IgA units (ref 0–25)
Beta-2-Glycoprotein I IgM: <9 GPI IgM units (ref 0–32)

## 2015-06-14 LAB — LUPUS ANTICOAGULANT PANEL
DRVVT: 41.8 s (ref 0.0–44.0)
PTT Lupus Anticoagulant: 36.4 s (ref 0.0–43.6)

## 2015-06-14 LAB — PROTEIN S, TOTAL: Protein S Ag, Total: 125 % (ref 60–150)

## 2015-06-14 LAB — PROTEIN C ACTIVITY: Protein C Activity: 137 % (ref 73–180)

## 2015-06-14 LAB — PROTEIN S ACTIVITY: Protein S Activity: 102 % (ref 63–140)

## 2015-06-14 LAB — PROTEIN C, TOTAL: Protein C, Total: 99 % (ref 60–150)

## 2015-06-14 LAB — ANTITHROMBIN III: AntiThromb III Func: 108 % (ref 75–120)

## 2015-06-14 NOTE — Telephone Encounter (Signed)
Received records from [facility] for appointment on 06/28/15 with Dr Debara Pickett.  Records given to [facility] (medical records) for Dr Lysbeth Penner schedule on 06/28/15. lp

## 2015-06-15 ENCOUNTER — Emergency Department (HOSPITAL_COMMUNITY)
Admission: EM | Admit: 2015-06-15 | Discharge: 2015-06-15 | Disposition: A | Discharge status: Home / Self Care | Payer: BLUE CROSS/BLUE SHIELD | Attending: Emergency Medicine | Admitting: Emergency Medicine

## 2015-06-15 ENCOUNTER — Emergency Department (HOSPITAL_COMMUNITY): Payer: BLUE CROSS/BLUE SHIELD

## 2015-06-15 ENCOUNTER — Encounter (HOSPITAL_COMMUNITY): Payer: Self-pay

## 2015-06-15 DIAGNOSIS — Z7982 Long term (current) use of aspirin: Secondary | ICD-10-CM | POA: Diagnosis not present

## 2015-06-15 DIAGNOSIS — Z8639 Personal history of other endocrine, nutritional and metabolic disease: Secondary | ICD-10-CM | POA: Diagnosis not present

## 2015-06-15 DIAGNOSIS — Z8673 Personal history of transient ischemic attack (TIA), and cerebral infarction without residual deficits: Secondary | ICD-10-CM | POA: Diagnosis not present

## 2015-06-15 DIAGNOSIS — Z8774 Personal history of (corrected) congenital malformations of heart and circulatory system: Secondary | ICD-10-CM | POA: Insufficient documentation

## 2015-06-15 DIAGNOSIS — F419 Anxiety disorder, unspecified: Secondary | ICD-10-CM | POA: Insufficient documentation

## 2015-06-15 DIAGNOSIS — R079 Chest pain, unspecified: Secondary | ICD-10-CM | POA: Diagnosis present

## 2015-06-15 DIAGNOSIS — R1013 Epigastric pain: Secondary | ICD-10-CM | POA: Insufficient documentation

## 2015-06-15 DIAGNOSIS — Z8679 Personal history of other diseases of the circulatory system: Secondary | ICD-10-CM | POA: Insufficient documentation

## 2015-06-15 DIAGNOSIS — Z79899 Other long term (current) drug therapy: Secondary | ICD-10-CM | POA: Insufficient documentation

## 2015-06-15 LAB — CBC
HCT: 37.1 % (ref 36.0–46.0)
Hemoglobin: 12.4 g/dL (ref 12.0–15.0)
MCH: 29.5 pg (ref 26.0–34.0)
MCHC: 33.4 g/dL (ref 30.0–36.0)
MCV: 88.3 fL (ref 78.0–100.0)
Platelets: 145 10*3/uL — ABNORMAL LOW (ref 150–400)
RBC: 4.2 MIL/uL (ref 3.87–5.11)
RDW: 13.6 % (ref 11.5–15.5)
WBC: 7.5 10*3/uL (ref 4.0–10.5)

## 2015-06-15 LAB — CARDIOLIPIN ANTIBODIES, IGG, IGM, IGA
Anticardiolipin IgA: <9 APL U/mL (ref 0–11)
Anticardiolipin IgG: <9 GPL U/mL (ref 0–14)
Anticardiolipin IgM: <9 MPL U/mL (ref 0–12)

## 2015-06-15 LAB — I-STAT TROPONIN, ED: Troponin i, poc: 0 ng/mL (ref 0.00–0.08)

## 2015-06-15 LAB — BASIC METABOLIC PANEL
Anion gap: 11 (ref 5–15)
BUN: 8 mg/dL (ref 6–20)
CO2: 23 mmol/L (ref 22–32)
Calcium: 9.2 mg/dL (ref 8.9–10.3)
Chloride: 105 mmol/L (ref 101–111)
Creatinine, Ser: 0.66 mg/dL (ref 0.44–1.00)
GFR calc Af Amer: >60 mL/min (ref >60)
GFR calc non Af Amer: >60 mL/min (ref >60)
Glucose, Bld: 112 mg/dL — ABNORMAL HIGH (ref 65–99)
Potassium: 3.7 mmol/L (ref 3.5–5.1)
Sodium: 139 mmol/L (ref 135–145)

## 2015-06-15 LAB — HEPATIC FUNCTION PANEL
ALT: 19 U/L (ref 14–54)
AST: 24 U/L (ref 15–41)
Albumin: 4.2 g/dL (ref 3.5–5.0)
Alkaline Phosphatase: 50 U/L (ref 38–126)
Bilirubin, Direct: <0.1 mg/dL — ABNORMAL LOW (ref 0.1–0.5)
Total Bilirubin: 0.6 mg/dL (ref 0.3–1.2)
Total Protein: 7.5 g/dL (ref 6.5–8.1)

## 2015-06-15 LAB — LIPASE, BLOOD: Lipase: 31 U/L (ref 11–51)

## 2015-06-15 LAB — FACTOR 5 LEIDEN

## 2015-06-15 MED ORDER — GI COCKTAIL ~~LOC~~
30.0000 mL | Freq: Once | ORAL | Status: AC
  Administered 2015-06-15: 30 mL via ORAL
  Filled 2015-06-15: qty 30

## 2015-06-15 MED ORDER — PANTOPRAZOLE SODIUM 20 MG PO TBEC: 20.0000 mg | DELAYED_RELEASE_TABLET | Freq: Two times a day (BID) | ORAL | Status: DC

## 2015-06-15 NOTE — ED Notes (Signed)
MD at bedside. 

## 2015-06-15 NOTE — Discharge Instructions (Signed)
Protonix as prescribed.  I have spoken with the on-call cardiologist. Your information will be forwarded to the [facility] cardiology office to help arrange an expedited appointment.

## 2015-06-15 NOTE — ED Provider Notes (Signed)
CSN: [id number]     Arrival date & time 06/15/15  1255 History   First MD Initiated Contact with Patient 06/15/15 1709     Chief Complaint  Patient presents with  . Chest Pain     (Consider location/radiation/quality/duration/timing/severity/associated sxs/prior Treatment) HPI Comments: Patient is a 40 year old female with past medical history of ASD with helix repair in 2010. She presents today with complaints of chest discomfort that has been going on for the past 6 days. Her pain is constant and is described as a "knife going through to her chest into her back". It is not worsened with breathing, exertion, or palpation. There are no alleviating factors.   She was seen at [facility] 3 days ago and had an EKG and laboratory studies which were reassuring. She was then discharged with follow-up with cardiology. This appointment is not due for another 2 weeks. She also reports seeing her primary Dr. and undergoing a CT Angio of her chest which was negative for pulmonary embolus or other acute pathology.  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  Patient is a 40 y.o. female presenting with chest pain. The history is provided by the patient.  Chest Pain Pain location:  Substernal area Pain quality: stabbing   Pain radiates to:  Does not radiate Pain radiates to the back: yes   Pain severity:  Moderate Onset quality:  Sudden Duration:  6 days Timing:  Constant Chronicity:  New Relieved by:  Nothing Worsened by:  Nothing tried Ineffective treatments:  None tried Associated symptoms: no shortness of breath     Past Medical History  Diagnosis Date  . TIA (transient ischemic attack)   . Ocular migraine   . ASD (atrial septal defect)   . Anxiety   . High cholesterol    Past Surgical History  Procedure Laterality Date  . Tonsillectomy    . Helix device    . Vein laser surgery    . Asd repair     Family History   Problem Relation Age of Onset  . Hypertension Mother   . Coronary artery disease Mother   . Coronary artery disease Maternal Grandmother   . Hypertension Maternal Grandmother   . ALS Maternal Grandmother    Social History  Substance Use Topics  . Smoking status: Never Smoker   . Smokeless tobacco: None  . Alcohol Use: 0.0 oz/week    0 Standard drinks or equivalent per week     Comment: occasional   OB History    No data available     Review of Systems  Respiratory: Negative for shortness of breath.   Cardiovascular: Positive for chest pain.  All other systems reviewed and are negative.     Allergies  Review of patient's allergies indicates no known allergies.  Home Medications   Prior to Admission medications   Medication Sig Start Date End Date Taking? Authorizing Provider  ALPRAZolam Duanne Moron) 1 MG tablet Take 1 mg by mouth 2 (two) times daily as needed for anxiety.   Yes Historical Provider, MD  aspirin EC 325 MG tablet Take 325 mg by mouth daily.    Yes Historical Provider, MD  cetirizine (ZYRTEC) 10 MG tablet Take 10 mg by mouth daily as needed for allergies or rhinitis.   Yes Historical Provider, MD  ibuprofen (ADVIL,MOTRIN) 200 MG tablet Take 800 mg by mouth every 6 (six) hours as needed for mild pain.  Yes Historical Provider, MD  OVER THE COUNTER MEDICATION Take 2 capsules by mouth at bedtime as needed (for sleep). *[place] - sleep supplement*   Yes Historical Provider, MD   BP 116/79 mmHg  Pulse 89  Temp(Src) 98.5 F (36.9 C) (Oral)  Resp 22  Ht 5\' 10"  (1.778 m)  Wt 210 lb (95.255 kg)  BMI 30.13 kg/m2  SpO2 100%  LMP 05/29/2015 Physical Exam  Constitutional: She is oriented to person, place, and time. She appears well-developed and well-nourished. No distress.  HENT:  Head: Normocephalic and atraumatic.  Neck: Normal range of motion. Neck supple.  Cardiovascular: Normal rate and regular rhythm.  Exam reveals no gallop and no friction rub.   No murmur  heard. Pulmonary/Chest: Effort normal and breath sounds normal. No respiratory distress. She has no wheezes.  Abdominal: Soft. Bowel sounds are normal. She exhibits no distension. There is no tenderness.  Musculoskeletal: Normal range of motion. She exhibits no edema or tenderness.  There is no calf tenderness or swelling. Homans sign is absent bilaterally.  Neurological: She is alert and oriented to person, place, and time.  Skin: Skin is warm and dry. She is not diaphoretic.  Nursing note and vitals reviewed.   ED Course  Procedures (including critical care time) Labs Review Labs Reviewed  BASIC METABOLIC PANEL - Abnormal; Notable for the following:    Glucose, Bld 112 (*)    All other components within normal limits  CBC - Abnormal; Notable for the following:    Platelets 145 (*)    All other components within normal limits  HEPATIC FUNCTION PANEL  I-STAT TROPOININ, ED    Imaging Review No results found. I have personally reviewed and evaluated these images and lab results as part of my medical decision-making.   EKG Interpretation   Date/Time:  Thursday June 15 2015 13:00:03 EDT Ventricular Rate:  86 PR Interval:  166 QRS Duration: 78 QT Interval:  362 QTC Calculation: 433 R Axis:   69 Text Interpretation:  Normal sinus rhythm Nonspecific T wave abnormality  Abnormal ECG Confirmed by Shataya Winkles  MD, Delontae Lamm ([id number]) on 06/15/2015 5:15:25  PM      MDM   Final diagnoses:  Epigastric pain    Patient is a 40 year old female with no prior cardiac history who presents with a several day history of sharp pain in the center of her chest. This is her second visit to the ER and she is also seen her primary doctor once for these complaints. She has undergone multiple EKGs, had multiple negative troponins, and a CT scan of the chest which has failed to reveal pulmonary embolism or other acute pathology. I have also obtained an ultrasound this evening which reveals no evidence for  gallstones or other abnormality. Her LFTs and lipase are normal.  She repeatedly references excessive stress in her life related to her new business ventures and I suspect anxiety may play a significant component in her symptoms. She did have some relief with a GI cocktail while in the ER and she will be started on Protonix.  I've spoken with Dr. Rosendo Gros who is on-call for cardiology. He will forward the patient's information to the oncoming cardiology team in the morning so that an expedited appointment can be arranged for Ms. Ravi to be seen sooner than the 2 weeks she was quoted when she called there before.    Veryl Speak, MD 06/15/15 2036

## 2015-06-15 NOTE — ED Notes (Signed)
Patient here with ongoing CP and was seen at [facility] earlier in week. Reports all negative labs, EKG, had negative CT-A yesterday for this ongoing pain. Reports has been taking ibuprofen for same with no relief. Did get relief with SL NTG at [facility]

## 2015-06-15 NOTE — ED Notes (Signed)
Patient verbalized understanding of discharge instructions and denies any further needs or questions at this time. VS stable. Patient ambulatory with steady gait.  

## 2015-06-16 LAB — PROTHROMBIN GENE MUTATION

## 2015-06-28 ENCOUNTER — Encounter: Payer: Self-pay | Admitting: Internal Medicine

## 2015-06-28 ENCOUNTER — Ambulatory Visit (INDEPENDENT_AMBULATORY_CARE_PROVIDER_SITE_OTHER): Payer: BLUE CROSS/BLUE SHIELD | Admitting: Internal Medicine

## 2015-06-28 VITALS — BP 126/80 | HR 80

## 2015-06-28 DIAGNOSIS — R079 Chest pain, unspecified: Secondary | ICD-10-CM | POA: Diagnosis not present

## 2015-06-28 DIAGNOSIS — Z8249 Family history of ischemic heart disease and other diseases of the circulatory system: Secondary | ICD-10-CM

## 2015-06-28 DIAGNOSIS — R9431 Abnormal electrocardiogram [ECG] [EKG]: Secondary | ICD-10-CM

## 2015-06-28 DIAGNOSIS — Z9889 Other specified postprocedural states: Secondary | ICD-10-CM

## 2015-06-28 DIAGNOSIS — Z8673 Personal history of transient ischemic attack (TIA), and cerebral infarction without residual deficits: Secondary | ICD-10-CM

## 2015-06-28 DIAGNOSIS — E785 Hyperlipidemia, unspecified: Secondary | ICD-10-CM | POA: Diagnosis not present

## 2015-06-28 DIAGNOSIS — R072 Precordial pain: Secondary | ICD-10-CM

## 2015-06-28 DIAGNOSIS — Z8774 Personal history of (corrected) congenital malformations of heart and circulatory system: Secondary | ICD-10-CM

## 2015-06-28 NOTE — Patient Instructions (Addendum)
Your physician has requested that you have an exercise tolerance test. For further information please visit [email or web address]. Please also follow instruction sheet, as given. This is done at Dr. Lysbeth Penner office.   Your physician has requested that you have a coronary calcium score - this is done at [address]   Your physician recommends that you schedule a follow-up appointment after your testing.     Exercise Stress Electrocardiogram An exercise stress electrocardiogram is a test that is done to evaluate the blood supply to your heart. This test may also be called exercise stress electrocardiography. The test is done while you are walking on a treadmill. The goal of this test is to raise your heart rate. This test is done to find areas of poor blood flow to the heart by determining the extent of coronary artery disease (CAD).   CAD is defined as narrowing in one or more heart (coronary) arteries of more than 70%. If you have an abnormal test result, this may mean that you are not getting adequate blood flow to your heart during exercise. Additional testing may be needed to understand why your test was abnormal. LET [facility] CARE PROVIDER KNOW ABOUT:   Any allergies you have.  All medicines you are taking, including vitamins, herbs, eye drops, creams, and over-the-counter medicines.  Previous problems you or members of your family have had with the use of anesthetics.  Any blood disorders you have.  Previous surgeries you have had.  Medical conditions you have.  Possibility of pregnancy, if this applies. RISKS AND COMPLICATIONS Generally, this is a safe procedure. However, as with any procedure, complications can occur. Possible complications can include:  Pain or pressure in the following areas:  Chest.  Jaw or neck.  Between your shoulder blades.  Radiating down your left arm.  Dizziness or light-headedness.  Shortness of breath.  Increased or  irregular heartbeats.  Nausea or vomiting.  Heart attack (rare). BEFORE THE PROCEDURE  Avoid all forms of caffeine 24 hours before your test or as directed by your health care provider. This includes coffee, tea (even decaffeinated tea), caffeinated sodas, chocolate, cocoa, and certain pain medicines.  Follow your health care provider's instructions regarding eating and drinking before the test.  Take your medicines as directed at regular times with water unless instructed otherwise. Exceptions may include:  If you have diabetes, ask how you are to take your insulin or pills. It is common to adjust insulin dosing the morning of the test.  If you are taking beta-blocker medicines, it is important to talk to your health care provider about these medicines well before the date of your test. Taking beta-blocker medicines may interfere with the test. In some cases, these medicines need to be changed or stopped 24 hours or more before the test.  If you wear a nitroglycerin patch, it may need to be removed prior to the test. Ask your health care provider if the patch should be removed before the test.  If you use an inhaler for any breathing condition, bring it with you to the test.  If you are an outpatient, bring a snack so you can eat right after the stress phase of the test.  Do not smoke for 4 hours prior to the test or as directed by your health care provider.  Do not apply lotions, powders, creams, or oils on your chest prior to the test.  Wear loose-fitting clothes and comfortable shoes for the test.  This test involves walking on a treadmill. PROCEDURE  Multiple patches (electrodes) will be put on your chest. If needed, small areas of your chest may have to be shaved to get better contact with the electrodes. Once the electrodes are attached to your body, multiple wires will be attached to the electrodes and your heart rate will be monitored.  Your heart will be monitored both at  rest and while exercising.  You will walk on a treadmill. The treadmill will be started at a slow pace. The treadmill speed and incline will gradually be increased to raise your heart rate. AFTER THE PROCEDURE  Your heart rate and blood pressure will be monitored after the test.  You may return to your normal schedule including diet, activities, and medicines, unless your health care provider tells you otherwise.   This information is not intended to replace advice given to you by your health care provider. Make sure you discuss any questions you have with your health care provider.   Document Released: 03/15/2000 Document Revised: 03/23/2013 Document Reviewed: 11/23/2012 Elsevier Interactive Patient Education 2016 Elsevier Inc.  Coronary Calcium Scan A coronary calcium scan is an imaging test used to look for deposits of calcium and other fatty materials (plaques) in the inner lining of the blood vessels of your heart (coronary arteries). These deposits of calcium and plaques can partly clog and narrow the coronary arteries without producing any symptoms or warning signs. This puts you at risk for a heart attack. This test can detect these deposits before symptoms develop.  LET [facility] CARE PROVIDER KNOW ABOUT:  Any allergies you have.  All medicines you are taking, including vitamins, herbs, eye drops, creams, and over-the-counter medicines.  Previous problems you or members of your family have had with the use of anesthetics.  Any blood disorders you have.  Previous surgeries you have had.  Medical conditions you have.  Possibility of pregnancy, if this applies. RISKS AND COMPLICATIONS Generally, this is a safe procedure. However, as with any procedure, complications can occur. This test involves the use of radiation. Radiation exposure can be dangerous to a pregnant woman and her unborn baby. If you are pregnant, you should not have this procedure done.  BEFORE THE  PROCEDURE There is no special preparation for the procedure. PROCEDURE  You will need to undress and put on a hospital gown. You will need to remove any jewelry around your neck or chest.  Sticky electrodes are placed on your chest and are connected to an electrocardiogram (EKG or electrocardiography) machine to recorda tracing of the electrical activity of your heart.  A CT scanner will take pictures of your heart. During this time, you will be asked to lie still and hold your breath for 2-3 seconds while a picture is being taken of your heart. AFTER THE PROCEDURE   You will be allowed to get dressed.  You can return to your normal activities after the scan is done.   This information is not intended to replace advice given to you by your health care provider. Make sure you discuss any questions you have with your health care provider.   Document Released: 09/14/2007 Document Revised: 03/23/2013 Document Reviewed: 11/23/2012 Elsevier Interactive Patient Education [employer].

## 2015-06-28 NOTE — Progress Notes (Signed)
OFFICE NOTE  Chief Complaint:  Chest pain  Primary Care Physician: No PCP Per Patient  HPI:  Angie Martin is a 40 y.o. female who has a history of TIA in the past and was found to have an atrial septal defect. She was referred to [facility] in 2010 and underwent a 30 mm Helix ASD closure. She was followed by Dr. Jenne Pane. She last saw him in 2011 was felt to be doing well and was released from ongoing care. She also has a strong family history of coronary disease and has elevated cholesterol which is trying to manage with diet and exercise. 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 Recently she developed some chest discomfort and was seen by her primary care provider for this. She was advised to go to the emergency department for which she underwent numerous tests including a CT pulmonary angiogram which was negative for PE. Troponins were also negative. It was advised that she establish with a cardiologist for possible additional testing. Since that time she continues to have some mild chest discomfort as well as pain across her back. Seems to be worse when she is busy or under stressful situations. EKG was performed in the office today which shows normal sinus rhythm with poor R-wave progression across the precordium and a rate of 80. Her mother had a heart attack at age 53. She also reports a history of anxiety and takes Xanax "often".  PMHx:  Past Medical History  Diagnosis Date  . TIA (transient ischemic attack)   . Ocular migraine   . ASD (atrial septal defect)   . Anxiety   . High cholesterol     Past Surgical History  Procedure Laterality Date  . Tonsillectomy    . Helix device    . Vein laser surgery    . Asd repair      FAMHx:  Family History  Problem Relation Age of Onset  . Hypertension Mother   . Coronary artery disease Mother   . Coronary artery disease Maternal Grandmother   . Hypertension Maternal Grandmother   . ALS Maternal  Grandmother     SOCHx:   reports that she has never smoked. She does not have any smokeless tobacco history on file. She reports that she drinks alcohol. She reports that she does not use illicit drugs.  ALLERGIES:  No Known Allergies  ROS: Pertinent items noted in HPI and remainder of comprehensive ROS otherwise negative.  HOME MEDS: Current Outpatient Prescriptions  Medication Sig Dispense Refill  . ALPRAZolam (XANAX) 1 MG tablet Take 1 mg by mouth 2 (two) times daily as needed for anxiety.    Marland Kitchen aspirin EC 325 MG tablet Take 325 mg by mouth daily.     Marland Kitchen ibuprofen (ADVIL,MOTRIN) 200 MG tablet Take 800 mg by mouth every 6 (six) hours as needed for mild pain.     Marland Kitchen OVER THE COUNTER MEDICATION Take 2 capsules by mouth at bedtime as needed (for sleep). *[place] - sleep supplement*     No current facility-administered medications for this visit.    LABS/IMAGING: No results found for this or any previous visit (from the past 48 hour(s)). No results found.  WEIGHTS: Wt Readings from Last 3 Encounters:  06/15/15 210 lb (95.255 kg)  06/12/15 210 lb (95.255 kg)  08/17/14 197 lb 11.2 oz (89.676 kg)    VITALS: BP 126/80 mmHg  Pulse 80  LMP 05/29/2015  EXAM: General appearance:  alert and no distress Neck: no carotid bruit and no JVD Lungs: clear to auscultation bilaterally Heart: regular rate and rhythm, S1, S2 normal, no murmur, click, rub or gallop Abdomen: soft, non-tender; bowel sounds normal; no masses,  no organomegaly Extremities: extremities normal, atraumatic, no cyanosis or edema Pulses: 2+ and symmetric Skin: Skin color, texture, turgor normal. No rashes or lesions Neurologic: Grossly normal Psych: Mildly anxious  EKG: Normal sinus rhythm at 80, poor R-wave progression anteriorly  ASSESSMENT: 1. Chest pain with mild EKG abnormalities 2. History of ASD status post 30 mm Helix closure 3. History of TIA, possibly related to her atrial septal  defect 4. Dyslipidemia 5. Family history of premature coronary disease  PLAN: 1.   Ms. Tessler is describing chest pain which is somewhat atypical. It's sharp and does not radiate. It tends to be worse under stressful situations and not necessarily with exertion or relieved by rest. There is some associated back discomfort. She is quite young for coronary disease and would be considered early onset. There was coronary disease in her mother at an early age. Although she has a history of congenital heart disease it does not necessarily mean she is at increased risk for developing new coronary artery disease. That being said, she is called to place starting a new exercise program and wants to work on dietary changes and weight loss to help with her cholesterol. I think prior to that would be reasonable to get an exercise treadmill stress test to rule out ischemia and gauge her exercise ability. In addition, I offered her a coronary artery calcium score. This may be helpful to determine whether she has any evidence of premature coronary artery disease and could help better risk stratify her as well as may be information that would be helpful in determining whether she should have more aggressive treatment of her cholesterol. Her most recent LDL was 166 with a total cholesterol 236 and her cholesterol/HDL ratio was high at 4.7. Plan to see her back to discuss the results of the studies in a few weeks.  Thanks again for the kind referral.  Pixie Casino, MD, [facility] Attending Cardiologist [place] 06/28/2015, 12:47 PM

## 2015-07-07 ENCOUNTER — Ambulatory Visit (INDEPENDENT_AMBULATORY_CARE_PROVIDER_SITE_OTHER)
Admission: RE | Admit: 2015-07-07 | Discharge: 2015-07-07 | Disposition: A | Discharge status: Home / Self Care | Payer: Self-pay | Source: Ambulatory Visit | Attending: Internal Medicine | Admitting: Internal Medicine

## 2015-07-07 ENCOUNTER — Ambulatory Visit (INDEPENDENT_AMBULATORY_CARE_PROVIDER_SITE_OTHER): Payer: BLUE CROSS/BLUE SHIELD

## 2015-07-07 DIAGNOSIS — E785 Hyperlipidemia, unspecified: Secondary | ICD-10-CM

## 2015-07-07 DIAGNOSIS — Z8249 Family history of ischemic heart disease and other diseases of the circulatory system: Secondary | ICD-10-CM

## 2015-07-07 DIAGNOSIS — R079 Chest pain, unspecified: Secondary | ICD-10-CM

## 2015-07-07 DIAGNOSIS — R9431 Abnormal electrocardiogram [ECG] [EKG]: Secondary | ICD-10-CM

## 2015-07-07 LAB — EXERCISE TOLERANCE TEST
Estimated workload: 10.9 METS
Exercise duration (min): 9 min
Exercise duration (sec): 30 s
MPHR: 179 {beats}/min
Peak HR: 173 {beats}/min
Percent HR: 96 %
RPE: 17
Rest HR: 86 {beats}/min

## 2015-07-17 ENCOUNTER — Ambulatory Visit: Payer: BLUE CROSS/BLUE SHIELD | Admitting: Internal Medicine

## 2015-07-18 NOTE — Progress Notes (Signed)
Called patient for PAT appt. In screening office.  Patient may need cardiac clearance - Had a Stress Test on 07/07/15-was normal, hx of TIA at age 40, atrial septal defect but had surgery at [facility] in 2010 for Helix ASD closure.  Cardiologist is Dr Lyman Bishop.  Scheduled PAT on 09/13/15 at 830am.

## 2015-08-17 ENCOUNTER — Ambulatory Visit: Payer: BLUE CROSS/BLUE SHIELD | Admitting: Neurology

## 2015-08-24 ENCOUNTER — Other Ambulatory Visit: Payer: Self-pay | Admitting: Obstetrics and Gynecology

## 2015-09-12 NOTE — Patient Instructions (Addendum)
Your procedure is scheduled on:  Wednesday, June 28  Enter through the [employer] [facility] at:  6AM  Pick up the phone at the desk and dial [phone].  Call this number if you have problems the morning of surgery: [phone].  Remember: Do NOT eat or  Drink after midnight Tuesday, 6/27  Take these medicines the morning of surgery with a SIP OF WATER:  Xanax if needed  Do NOT wear jewelry (body piercing), metal hair clips/bobby pins, make-up, or nail polish. Do NOT wear lotions, powders, or perfumes.  You may wear deoderant. Do NOT shave for 48 hours prior to surgery. Do NOT bring valuables to the hospital.   Leave suitcase in car.  After surgery it may be brought to your room.  For patients admitted to the hospital, checkout time is 11:00 AM the day of discharge. Home with partner Gay Filler cell [phone].

## 2015-09-13 ENCOUNTER — Encounter (HOSPITAL_COMMUNITY)
Admission: RE | Admit: 2015-09-13 | Discharge: 2015-09-13 | Disposition: A | Discharge status: Home / Self Care | Payer: BLUE CROSS/BLUE SHIELD | Source: Ambulatory Visit | Attending: Obstetrics and Gynecology | Admitting: Obstetrics and Gynecology

## 2015-09-13 ENCOUNTER — Encounter (HOSPITAL_COMMUNITY): Payer: Self-pay

## 2015-09-13 VITALS — BP 110/82 | HR 79 | Resp 18 | Ht 68.75 in | Wt 207.5 lb

## 2015-09-13 DIAGNOSIS — Z01812 Encounter for preprocedural laboratory examination: Secondary | ICD-10-CM | POA: Insufficient documentation

## 2015-09-13 DIAGNOSIS — S0502XA Injury of conjunctiva and corneal abrasion without foreign body, left eye, initial encounter: Secondary | ICD-10-CM

## 2015-09-13 HISTORY — DX: Major depressive disorder, single episode, unspecified: F32.9

## 2015-09-13 HISTORY — DX: Benign neoplasm of connective and other soft tissue, unspecified: D21.9

## 2015-09-13 HISTORY — DX: Depression, unspecified: F32.A

## 2015-09-13 LAB — TYPE AND SCREEN
ABO/RH(D): A POS
Antibody Screen: NEGATIVE

## 2015-09-13 LAB — CBC
HCT: 38.6 % (ref 36.0–46.0)
Hemoglobin: 12.7 g/dL (ref 12.0–15.0)
MCH: 27.7 pg (ref 26.0–34.0)
MCHC: 32.9 g/dL (ref 30.0–36.0)
MCV: 84.3 fL (ref 78.0–100.0)
Platelets: 137 10*3/uL — ABNORMAL LOW (ref 150–400)
RBC: 4.58 MIL/uL (ref 3.87–5.11)
RDW: 14.2 % (ref 11.5–15.5)
WBC: 6.6 10*3/uL (ref 4.0–10.5)

## 2015-09-13 LAB — ABO/RH: ABO/RH(D): A POS

## 2015-09-13 NOTE — Pre-Procedure Instructions (Signed)
Called Kati in [place] at ext. [phone]  requesting her to pray with patient prior to going back to surgery on 6/28. (Pager [phone]).  LMOM to put prayer request on calendar.  Note on chart for SS RN to call/page once patient is ready for surgery.

## 2015-09-26 ENCOUNTER — Encounter (HOSPITAL_COMMUNITY): Payer: Self-pay | Admitting: Anesthesiology

## 2015-09-26 NOTE — Anesthesia Preprocedure Evaluation (Addendum)
Anesthesia Evaluation  Patient identified by MRN, date of birth, ID band Patient awake    Reviewed: Allergy & Precautions, H&P , NPO status , Patient's Chart, lab work & pertinent test results  Airway Mallampati: I  TM Distance: >3 FB Neck ROM: full    Dental no notable dental hx. (+) Teeth Intact   Pulmonary neg pulmonary ROS,    Pulmonary exam normal        Cardiovascular negative cardio ROS Normal cardiovascular exam     Neuro/Psych    GI/Hepatic negative GI ROS, Neg liver ROS,   Endo/Other  negative endocrine ROS  Renal/GU negative Renal ROS     Musculoskeletal   Abdominal Normal abdominal exam  (+)   Peds  Hematology negative hematology ROS (+)   Anesthesia Other Findings   Reproductive/Obstetrics negative OB ROS                            Anesthesia Physical Anesthesia Plan  ASA: II  Anesthesia Plan: General   Post-op Pain Management:    Induction: Intravenous  Airway Management Planned: Oral ETT  Additional Equipment:   Intra-op Plan:   Post-operative Plan: Extubation in OR  Informed Consent: I have reviewed the patients History and Physical, chart, labs and discussed the procedure including the risks, benefits and alternatives for the proposed anesthesia with the patient or authorized representative who has indicated his/her understanding and acceptance.     Plan Discussed with: CRNA and Surgeon  Anesthesia Plan Comments:        Anesthesia Quick Evaluation

## 2015-09-27 ENCOUNTER — Encounter (HOSPITAL_COMMUNITY): Payer: Self-pay | Admitting: *Deleted

## 2015-09-27 ENCOUNTER — Ambulatory Visit (HOSPITAL_COMMUNITY): Payer: BLUE CROSS/BLUE SHIELD | Admitting: Anesthesiology

## 2015-09-27 ENCOUNTER — Encounter (HOSPITAL_COMMUNITY)
Admission: RE | Disposition: A | Discharge status: Home / Self Care | Payer: Self-pay | Source: Ambulatory Visit | Attending: Obstetrics and Gynecology

## 2015-09-27 ENCOUNTER — Ambulatory Visit (HOSPITAL_COMMUNITY)
Admission: RE | Admit: 2015-09-27 | Discharge: 2015-09-28 | Disposition: A | Discharge status: Home / Self Care | Payer: BLUE CROSS/BLUE SHIELD | Source: Ambulatory Visit | Attending: Obstetrics and Gynecology | Admitting: Obstetrics and Gynecology

## 2015-09-27 DIAGNOSIS — N920 Excessive and frequent menstruation with regular cycle: Secondary | ICD-10-CM | POA: Insufficient documentation

## 2015-09-27 DIAGNOSIS — T404X5A Adverse effect of other synthetic narcotics, initial encounter: Secondary | ICD-10-CM | POA: Diagnosis not present

## 2015-09-27 DIAGNOSIS — Z8673 Personal history of transient ischemic attack (TIA), and cerebral infarction without residual deficits: Secondary | ICD-10-CM | POA: Insufficient documentation

## 2015-09-27 DIAGNOSIS — D250 Submucous leiomyoma of uterus: Secondary | ICD-10-CM | POA: Insufficient documentation

## 2015-09-27 DIAGNOSIS — N838 Other noninflammatory disorders of ovary, fallopian tube and broad ligament: Secondary | ICD-10-CM | POA: Diagnosis not present

## 2015-09-27 DIAGNOSIS — D252 Subserosal leiomyoma of uterus: Secondary | ICD-10-CM | POA: Insufficient documentation

## 2015-09-27 DIAGNOSIS — Z9889 Other specified postprocedural states: Secondary | ICD-10-CM

## 2015-09-27 DIAGNOSIS — Z8774 Personal history of (corrected) congenital malformations of heart and circulatory system: Secondary | ICD-10-CM | POA: Diagnosis not present

## 2015-09-27 DIAGNOSIS — S0502XA Injury of conjunctiva and corneal abrasion without foreign body, left eye, initial encounter: Secondary | ICD-10-CM | POA: Insufficient documentation

## 2015-09-27 DIAGNOSIS — N83201 Unspecified ovarian cyst, right side: Secondary | ICD-10-CM | POA: Diagnosis not present

## 2015-09-27 HISTORY — PX: CYSTOSCOPY: SHX5120

## 2015-09-27 HISTORY — PX: ROBOTIC ASSISTED TOTAL HYSTERECTOMY: SHX6085

## 2015-09-27 SURGERY — ROBOTIC ASSISTED TOTAL HYSTERECTOMY
Anesthesia: General | Site: Urethra

## 2015-09-27 MED ORDER — STERILE WATER FOR IRRIGATION IR SOLN
Status: DC | PRN
  Administered 2015-09-27: 1000 mL

## 2015-09-27 MED ORDER — ONDANSETRON HCL 4 MG/2ML IJ SOLN
INTRAMUSCULAR | Status: DC | PRN
Start: 2015-09-27 — End: 2015-09-27
  Administered 2015-09-27: 4 mg via INTRAVENOUS

## 2015-09-27 MED ORDER — MIDAZOLAM HCL 5 MG/5ML IJ SOLN
INTRAMUSCULAR | Status: DC | PRN
  Administered 2015-09-27: 2 mg via INTRAVENOUS

## 2015-09-27 MED ORDER — DIPHENHYDRAMINE HCL 50 MG/ML IJ SOLN
50.0000 mg | Freq: Once | INTRAMUSCULAR | Status: AC
  Administered 2015-09-27: 50 mg via INTRAVENOUS

## 2015-09-27 MED ORDER — PROPOFOL 10 MG/ML IV BOLUS
INTRAVENOUS | Status: DC | PRN
  Administered 2015-09-27: 200 mg via INTRAVENOUS

## 2015-09-27 MED ORDER — MENTHOL 3 MG MT LOZG: 1.0000 | LOZENGE | OROMUCOSAL | Status: DC | PRN

## 2015-09-27 MED ORDER — DEXAMETHASONE SODIUM PHOSPHATE 4 MG/ML IJ SOLN
INTRAMUSCULAR | Status: DC | PRN
  Administered 2015-09-27: 4 mg via INTRAVENOUS

## 2015-09-27 MED ORDER — KETOROLAC TROMETHAMINE 0.5 % OP SOLN
1.0000 [drp] | Freq: Once | OPHTHALMIC | Status: AC
  Administered 2015-09-27: 1 [drp] via OPHTHALMIC
  Filled 2015-09-27: qty 3

## 2015-09-27 MED ORDER — ONDANSETRON HCL 4 MG PO TABS: 4.0000 mg | ORAL_TABLET | Freq: Four times a day (QID) | ORAL | Status: DC | PRN

## 2015-09-27 MED ORDER — KETOROLAC TROMETHAMINE 30 MG/ML IJ SOLN
INTRAMUSCULAR | Status: DC | PRN
  Administered 2015-09-27: 30 mg via INTRAVENOUS

## 2015-09-27 MED ORDER — SCOPOLAMINE 1 MG/3DAYS TD PT72
MEDICATED_PATCH | TRANSDERMAL | Status: AC
  Administered 2015-09-27: 1.5 mg via TRANSDERMAL
  Filled 2015-09-27: qty 1

## 2015-09-27 MED ORDER — KETOROLAC TROMETHAMINE 30 MG/ML IJ SOLN
INTRAMUSCULAR | Status: AC
  Filled 2015-09-27: qty 1

## 2015-09-27 MED ORDER — ALPRAZOLAM 0.5 MG PO TABS
1.0000 mg | ORAL_TABLET | Freq: Two times a day (BID) | ORAL | Status: DC | PRN
  Administered 2015-09-27 (×2): 1 mg via ORAL
  Filled 2015-09-27 (×2): qty 2

## 2015-09-27 MED ORDER — HYDROMORPHONE HCL 1 MG/ML IJ SOLN
INTRAMUSCULAR | Status: AC
  Filled 2015-09-27: qty 1

## 2015-09-27 MED ORDER — NALOXONE HCL 0.4 MG/ML IJ SOLN
0.4000 mg | INTRAMUSCULAR | Status: DC | PRN
  Administered 2015-09-27 (×4): 0.4 mg via INTRAVENOUS

## 2015-09-27 MED ORDER — PROPARACAINE HCL 0.5 % OP SOLN
1.0000 [drp] | OPHTHALMIC | Status: DC | PRN
  Administered 2015-09-27 (×3): 1 [drp] via OPHTHALMIC
  Filled 2015-09-27: qty 15

## 2015-09-27 MED ORDER — MIDAZOLAM HCL 2 MG/2ML IJ SOLN
INTRAMUSCULAR | Status: AC
  Filled 2015-09-27: qty 2

## 2015-09-27 MED ORDER — LACTATED RINGERS IR SOLN
Status: DC | PRN
  Administered 2015-09-27: 3000 mL

## 2015-09-27 MED ORDER — DIPHENHYDRAMINE HCL 50 MG/ML IJ SOLN
INTRAMUSCULAR | Status: AC
  Filled 2015-09-27: qty 1

## 2015-09-27 MED ORDER — SUGAMMADEX SODIUM 200 MG/2ML IV SOLN
INTRAVENOUS | Status: DC | PRN
  Administered 2015-09-27: 188.2 mg via INTRAVENOUS

## 2015-09-27 MED ORDER — IBUPROFEN 800 MG PO TABS
800.0000 mg | ORAL_TABLET | Freq: Three times a day (TID) | ORAL | Status: DC | PRN
  Administered 2015-09-27: 800 mg via ORAL
  Filled 2015-09-27: qty 1

## 2015-09-27 MED ORDER — FENTANYL CITRATE (PF) 100 MCG/2ML IJ SOLN
INTRAMUSCULAR | Status: DC | PRN
  Administered 2015-09-27: 100 ug via INTRAVENOUS
  Administered 2015-09-27 (×4): 50 ug via INTRAVENOUS

## 2015-09-27 MED ORDER — OXYCODONE-ACETAMINOPHEN 5-325 MG PO TABS
1.0000 | ORAL_TABLET | ORAL | Status: DC | PRN
  Administered 2015-09-27 (×2): 1 via ORAL
  Filled 2015-09-27 (×2): qty 1

## 2015-09-27 MED ORDER — SODIUM CHLORIDE 0.9 % IV SOLN
INTRAVENOUS | Status: DC | PRN
  Administered 2015-09-27: 120 mL

## 2015-09-27 MED ORDER — SODIUM CHLORIDE 0.9 % IJ SOLN
INTRAMUSCULAR | Status: AC
  Filled 2015-09-27: qty 50

## 2015-09-27 MED ORDER — ONDANSETRON HCL 4 MG/2ML IJ SOLN: 4.0000 mg | Freq: Four times a day (QID) | INTRAMUSCULAR | Status: DC | PRN

## 2015-09-27 MED ORDER — ARTIFICIAL TEARS OP OINT
TOPICAL_OINTMENT | OPHTHALMIC | Status: AC
  Filled 2015-09-27: qty 3.5

## 2015-09-27 MED ORDER — PROPOFOL 10 MG/ML IV BOLUS: INTRAVENOUS | Status: DC | PRN

## 2015-09-27 MED ORDER — SUGAMMADEX SODIUM 200 MG/2ML IV SOLN
INTRAVENOUS | Status: AC
  Filled 2015-09-27: qty 4

## 2015-09-27 MED ORDER — TETRACAINE HCL 0.5 % OP SOLN
1.0000 [drp] | OPHTHALMIC | Status: DC | PRN
  Administered 2015-09-27: 2 [drp] via OPHTHALMIC
  Filled 2015-09-27: qty 2

## 2015-09-27 MED ORDER — FENTANYL CITRATE (PF) 250 MCG/5ML IJ SOLN
INTRAMUSCULAR | Status: AC
  Filled 2015-09-27: qty 5

## 2015-09-27 MED ORDER — LACTATED RINGERS IV SOLN
INTRAVENOUS | Status: DC
  Administered 2015-09-27 (×3): via INTRAVENOUS

## 2015-09-27 MED ORDER — PROMETHAZINE HCL 25 MG/ML IJ SOLN: 6.2500 mg | INTRAMUSCULAR | Status: DC | PRN

## 2015-09-27 MED ORDER — GLYCOPYRROLATE 0.2 MG/ML IJ SOLN
INTRAMUSCULAR | Status: AC
  Filled 2015-09-27: qty 3

## 2015-09-27 MED ORDER — SCOPOLAMINE 1 MG/3DAYS TD PT72
1.0000 | MEDICATED_PATCH | Freq: Once | TRANSDERMAL | Status: DC
  Administered 2015-09-27: 1.5 mg via TRANSDERMAL

## 2015-09-27 MED ORDER — ROCURONIUM BROMIDE 100 MG/10ML IV SOLN
INTRAVENOUS | Status: DC | PRN
  Administered 2015-09-27 (×2): 10 mg via INTRAVENOUS
  Administered 2015-09-27: 40 mg via INTRAVENOUS
  Administered 2015-09-27: 5 mg via INTRAVENOUS

## 2015-09-27 MED ORDER — NALOXONE HCL 0.4 MG/ML IJ SOLN
INTRAMUSCULAR | Status: AC
  Filled 2015-09-27: qty 1

## 2015-09-27 MED ORDER — DEXAMETHASONE SODIUM PHOSPHATE 4 MG/ML IJ SOLN
INTRAMUSCULAR | Status: AC
  Filled 2015-09-27: qty 1

## 2015-09-27 MED ORDER — HYDROMORPHONE HCL 1 MG/ML IJ SOLN
INTRAMUSCULAR | Status: DC | PRN
  Administered 2015-09-27 (×2): 0.5 mg via INTRAVENOUS

## 2015-09-27 MED ORDER — ROPIVACAINE HCL 5 MG/ML IJ SOLN
INTRAMUSCULAR | Status: AC
  Filled 2015-09-27: qty 30

## 2015-09-27 MED ORDER — ONDANSETRON HCL 4 MG/2ML IJ SOLN
INTRAMUSCULAR | Status: AC
  Filled 2015-09-27: qty 2

## 2015-09-27 MED ORDER — MEPERIDINE HCL 25 MG/ML IJ SOLN: 6.2500 mg | INTRAMUSCULAR | Status: DC | PRN

## 2015-09-27 MED ORDER — NEOSTIGMINE METHYLSULFATE 10 MG/10ML IV SOLN
INTRAVENOUS | Status: AC
  Filled 2015-09-27: qty 1

## 2015-09-27 MED ORDER — HYDROMORPHONE HCL 1 MG/ML IJ SOLN: 0.2500 mg | INTRAMUSCULAR | Status: DC | PRN

## 2015-09-27 MED ORDER — LIDOCAINE HCL (CARDIAC) 20 MG/ML IV SOLN
INTRAVENOUS | Status: DC | PRN
  Administered 2015-09-27: 50 mg via INTRAVENOUS

## 2015-09-27 MED ORDER — KETOROLAC TROMETHAMINE 30 MG/ML IJ SOLN: 30.0000 mg | Freq: Four times a day (QID) | INTRAMUSCULAR | Status: DC

## 2015-09-27 MED ORDER — CEFAZOLIN SODIUM-DEXTROSE 2-4 GM/100ML-% IV SOLN
2.0000 g | INTRAVENOUS | Status: AC
  Administered 2015-09-27: 2 g via INTRAVENOUS

## 2015-09-27 MED ORDER — LIDOCAINE HCL (PF) 1 % IJ SOLN
INTRAMUSCULAR | Status: AC
  Filled 2015-09-27: qty 5

## 2015-09-27 MED ORDER — PROPOFOL 10 MG/ML IV BOLUS
INTRAVENOUS | Status: AC
  Filled 2015-09-27: qty 20

## 2015-09-27 MED ORDER — ERYTHROMYCIN 5 MG/GM OP OINT
TOPICAL_OINTMENT | Freq: Four times a day (QID) | OPHTHALMIC | Status: DC
  Administered 2015-09-27: 1 via OPHTHALMIC
  Filled 2015-09-27: qty 3.5

## 2015-09-27 MED ORDER — ARTIFICIAL TEARS OP OINT
TOPICAL_OINTMENT | OPHTHALMIC | Status: DC | PRN
  Administered 2015-09-27: 1 via OPHTHALMIC

## 2015-09-27 MED ORDER — FENTANYL CITRATE (PF) 100 MCG/2ML IJ SOLN
INTRAMUSCULAR | Status: AC
  Filled 2015-09-27: qty 2

## 2015-09-27 MED ORDER — KETOROLAC TROMETHAMINE 30 MG/ML IJ SOLN: 30.0000 mg | Freq: Once | INTRAMUSCULAR | Status: DC

## 2015-09-27 SURGICAL SUPPLY — 64 items
BARRIER ADHS 3X4 INTERCEED (GAUZE/BANDAGES/DRESSINGS) IMPLANT
CLOTH BEACON ORANGE TIMEOUT ST (SAFETY) ×3 IMPLANT
CONT PATH 16OZ SNAP LID 3702 (MISCELLANEOUS) ×3 IMPLANT
COVER BACK TABLE 60X90IN (DRAPES) ×6 IMPLANT
COVER TIP SHEARS 8 DVNC (MISCELLANEOUS) ×2 IMPLANT
COVER TIP SHEARS 8MM DA VINCI (MISCELLANEOUS) ×1
DECANTER SPIKE VIAL GLASS SM (MISCELLANEOUS) IMPLANT
DEFOGGER SCOPE WARMER CLEARIFY (MISCELLANEOUS) ×3 IMPLANT
DURAPREP 26ML APPLICATOR (WOUND CARE) ×3 IMPLANT
ELECT REM PT RETURN 9FT ADLT (ELECTROSURGICAL) ×3
ELECTRODE REM PT RTRN 9FT ADLT (ELECTROSURGICAL) ×2 IMPLANT
GAUZE VASELINE 3X9 (GAUZE/BANDAGES/DRESSINGS) IMPLANT
GLOVE BIO SURGEON STRL SZ 6.5 (GLOVE) ×3 IMPLANT
GLOVE BIO SURGEON STRL SZ7 (GLOVE) ×6 IMPLANT
GLOVE BIOGEL PI IND STRL 6.5 (GLOVE) ×2 IMPLANT
GLOVE BIOGEL PI IND STRL 7.0 (GLOVE) ×6 IMPLANT
GLOVE BIOGEL PI INDICATOR 6.5 (GLOVE) ×1
GLOVE BIOGEL PI INDICATOR 7.0 (GLOVE) ×3
GLOVE ECLIPSE 6.5 STRL STRAW (GLOVE) ×9 IMPLANT
GRASPER BIPOLAR FEN DA VINCI (INSTRUMENTS)
GRASPER BPLR FEN DVNC (INSTRUMENTS) IMPLANT
GYRUS RUMI II 2.5CM BLUE (DISPOSABLE)
GYRUS RUMI II 3.5CM BLUE (DISPOSABLE)
GYRUS RUMI II 4.0CM BLUE (DISPOSABLE)
KIT ACCESSORY DA VINCI DISP (KITS) ×1
KIT ACCESSORY DVNC DISP (KITS) ×2 IMPLANT
LEGGING LITHOTOMY PAIR STRL (DRAPES) ×3 IMPLANT
LIQUID BAND (GAUZE/BANDAGES/DRESSINGS) ×3 IMPLANT
MANIPULATOR UTERINE 4.5 ZUMI (MISCELLANEOUS) IMPLANT
NEEDLE INSUFFLATION 120MM (ENDOMECHANICALS) ×3 IMPLANT
OCCLUDER COLPOPNEUMO (BALLOONS) ×3 IMPLANT
PACK ROBOT WH (CUSTOM PROCEDURE TRAY) ×3 IMPLANT
PACK ROBOTIC GOWN (GOWN DISPOSABLE) ×3 IMPLANT
PAD PREP 24X48 CUFFED NSTRL (MISCELLANEOUS) ×6 IMPLANT
PAD TRENDELENBURG POSITION (MISCELLANEOUS) ×3 IMPLANT
POUCH LAPAROSCOPIC INSTRUMENT (MISCELLANEOUS) ×3 IMPLANT
RUMI II 3.0CM BLUE KOH-EFFICIE (DISPOSABLE) ×3 IMPLANT
RUMI II GYRUS 2.5CM BLUE (DISPOSABLE) IMPLANT
RUMI II GYRUS 3.5CM BLUE (DISPOSABLE) IMPLANT
RUMI II GYRUS 4.0CM BLUE (DISPOSABLE) IMPLANT
SET CYSTO W/LG BORE CLAMP LF (SET/KITS/TRAYS/PACK) IMPLANT
SET IRRIG TUBING LAPAROSCOPIC (IRRIGATION / IRRIGATOR) ×3 IMPLANT
SET TRI-LUMEN FLTR TB AIRSEAL (TUBING) IMPLANT
SUT DVC VLOC 180 0 12IN GS21 (SUTURE)
SUT VIC AB 2-0 CT2 27 (SUTURE) ×6 IMPLANT
SUT VIC AB 2-0 UR6 27 (SUTURE) ×3 IMPLANT
SUT VICRYL RAPIDE 3 0 (SUTURE) ×6 IMPLANT
SUT VLOC 180 0 9IN  GS21 (SUTURE)
SUT VLOC 180 0 9IN GS21 (SUTURE) IMPLANT
SUTURE DVC VLC 180 0 12IN GS21 (SUTURE) IMPLANT
SYR 50ML LL SCALE MARK (SYRINGE) ×3 IMPLANT
SYSTEM CONVERTIBLE TROCAR (TROCAR) IMPLANT
TIP RUMI ORANGE 6.7MMX12CM (TIP) IMPLANT
TIP UTERINE 5.1X6CM LAV DISP (MISCELLANEOUS) IMPLANT
TIP UTERINE 6.7X10CM GRN DISP (MISCELLANEOUS) IMPLANT
TIP UTERINE 6.7X6CM WHT DISP (MISCELLANEOUS) IMPLANT
TIP UTERINE 6.7X8CM BLUE DISP (MISCELLANEOUS) ×3 IMPLANT
TOWEL OR 17X24 6PK STRL BLUE (TOWEL DISPOSABLE) ×6 IMPLANT
TRAY FOLEY CATH SILVER 14FR (SET/KITS/TRAYS/PACK) ×3 IMPLANT
TROCAR DILATING TIP 12MM 150MM (ENDOMECHANICALS) ×3 IMPLANT
TROCAR DISP BLADELESS 8 DVNC (TROCAR) ×2 IMPLANT
TROCAR DISP BLADELESS 8MM (TROCAR) ×1
TROCAR PORT AIRSEAL 5X120 (TROCAR) IMPLANT
WATER STERILE IRR 1000ML POUR (IV SOLUTION) ×9 IMPLANT

## 2015-09-27 NOTE — Op Note (Signed)
09/27/2015  9:43 AM  PATIENT:  Angie Martin  40 y.o. female  PRE-OPERATIVE DIAGNOSIS:  FIBROIDS, menorraghia  POST-OPERATIVE DIAGNOSIS:  FIBROIDS. menorrhagia  PROCEDURE:  Procedure(s): ROBOTIC ASSISTED TOTAL HYSTERECTOMY, Bilateral Salpingectomy (N/A) CYSTOSCOPY  SURGEON:  Surgeon(s) and Role:    * Bobbye Charleston, MD - Primary    * Jerelyn Charles, MD - Assisting  LOCAL MEDICATIONS USED:  BUPIVICAINE   SPECIMEN:  Source of Specimen:  uterus,cervix and tubes  DISPOSITION OF SPECIMEN:  PATHOLOGY  COUNTS:  YES  DICTATION: .Note written in EPIC  PLAN OF CARE: Admit for overnight observation  PATIENT DISPOSITION:  PACU - hemodynamically stable.   Delay start of Pharmacological VTE agent (>24hrs) due to surgical blood loss or risk of bleeding: not applicable  Complications:  None.  Findings:  11 weeks size uterus with a 6 cm anterior submucosal fibroid.  L ovary was normal.  R ovary had a small hemorrhagic cyst.  The ureters were identified during multiple points of the case and were always out of the field of dissection.  On cystoscopy, the bladder was intact and bilateral spill was seen from each ureteral orriface.  There was a small plaque of endometriosis in cul de sac, cauterized with hot shears.  Medications:  Ancef.  Bupivicaine.    Technique:  After adequate anesthesia was achieved the patient was positioned, prepped and draped in usual sterile fashion.  A speculum was placed in the vagina and the cervix dilated with pratt dilators.  The  8 cm Rumi and 3 cm Koh ring were assembled and placed in proper fashion.  The  Speculum was removed and the bladder catheterized with a foley.    Attention was turned to the abdomen where a 1 cm incision was made 1 cm above the umbilicus.  The veress needle was introduced without aspiration of bowel contents or blood and the abdomen insufflated. The long 12 mm trocar was placed and the other three trocar sites were marked out, all  approximately 10 cm from each other and the camera.  Two 8.5 mm trocars were placed on either side of the camera port and a 5 mm assistant port was placed 3 cm above the L the plane of the other two trocarst.  All trocars were inserted under direct visualization of the camera.  The patient was placed in trendelenburg and then the Robot docked.  The PK forceps were placed on arm 2 and the Hot shears on arm 1 and introduced under direct visualization of the camera.  I then broke scrub and sat down at the console.  The above findings were noted and the ureters identified well out of the field of dissection.  The right fallopian tube was isolated and cauterized with the PK.  The Utero-ovarian ligament was then divided with the PK cautery and shears.  The posterior broad ligament was then divided with the hot shears until the uterosacral ligament.  The Broad and cardinal ligaments were then cauterized against the cervix to the level of the Koh ring, securing the uterine artery.  Each pedicle was then incised with the shears.  The anterior leaf was then incised at the reflection of the vessico-uterine junction and the lateral bladder retracted inferiorly after the round ligament had been divided with the PK forceps.  The left tube was cauterized with the PK and divided with the shears;  then the left utero-ovarian ligament divided with the PK forceps and the scissors.  The round ligament was divided as  well and the posterior leaf of the broad ligament then divided with the hot shears. The broad and cardinal ligaments were then cauterized on the left in the same way.   At the level of the internal os, the uterine arteries were bilaterally cauterized with the PK.  The ureters were identified well out of the field of dissection.  .   The bladder was then able to be retracted inferiorly and the vesico-uterine fascia was incised in the midline until the bladder was removed one cm below the Koh ring.  The hot shears then  circumferentially incised the vagina at the level of the reflection on the [facility] ring.  Once the uterus and cervix were amputated, cautery was used to insure hemostasis of the cuff.  Once hemostasis was achieved, the instruments were changed to the mega needle driver and mega suture cut needle driver and the cuff was closed with a running stitches of 0-vicryl V loc.   Cautery was used to ensure hemostasis of the left pedicles very superficially.  The ureters were peristalsing bilaterally well and very lateral to the areas of operation.    The Robot was then undocked and I scrubbed back in.  The needle was removed and Bupivicaine was introduced into the pelvis.  The fascia of the 12 mm trocar was closed with a figure of 8 stitch of 0 vicryl.  The skin incisions were closed with subcuticular stitches of 3-0 vicryl Rapide and Dermabond.  All instruments were removed from the vagina and cystoscopy performed, revealing an intact bladder and vigourous spill of indigo carmine from each ureteral orifice.  The cystoscope was removed and the patient taken to the recovery room in stable condition.  Andreea Arca A

## 2015-09-27 NOTE — Anesthesia Postprocedure Evaluation (Signed)
Anesthesia Post Note  Patient: Control and instrumentation engineer  Procedure(s) Performed: Procedure(s) (LRB): ROBOTIC ASSISTED TOTAL HYSTERECTOMY, Bilateral Salpingectomy (N/A) CYSTOSCOPY  Patient location during evaluation: PACU Anesthesia Type: General Level of consciousness: sedated Pain management: pain level controlled Vital Signs Assessment: post-procedure vital signs reviewed and stable Respiratory status: spontaneous breathing Cardiovascular status: stable Postop Assessment: no signs of nausea or vomiting Anesthetic complications: no     Last Vitals:  Filed Vitals:   09/27/15 1001 09/27/15 1015  BP:  124/79  Pulse: 84   Temp: 36.2 C   Resp:      Last Pain:  Filed Vitals:   09/27/15 1051  PainSc: 0-No pain   Pain Goal: Patients Stated Pain Goal: 3 (09/27/15 1015)               [place]

## 2015-09-27 NOTE — Progress Notes (Signed)
There has been no change in the patients history, status or exam since the history and physical.  Filed Vitals:   09/27/15 0614  BP: 125/91  Pulse: 71  Temp: 97.4 F (36.3 C)  TempSrc: Oral  Resp: 18  SpO2: 100%    Lab Results  Component Value Date   WBC 6.6 09/13/2015   HGB 12.7 09/13/2015   HCT 38.6 09/13/2015   MCV 84.3 09/13/2015   PLT 137* 09/13/2015    Percell Lamboy A

## 2015-09-27 NOTE — Transfer of Care (Signed)
Immediate Anesthesia Transfer of Care Note  Patient: Angie Martin  Procedure(s) Performed: Procedure(s): ROBOTIC ASSISTED TOTAL HYSTERECTOMY, Bilateral Salpingectomy (N/A) CYSTOSCOPY  Patient Location: PACU  Anesthesia Type:General  Level of Consciousness: awake, alert  and oriented  Airway & Oxygen Therapy: Patient Spontanous Breathing and Patient connected to nasal cannula oxygen  Post-op Assessment: Report given to RN and Post -op Vital signs reviewed and stable  Post vital signs: Reviewed and stable  Last Vitals:  Filed Vitals:   09/27/15 0614  BP: 125/91  Pulse: 71  Temp: 36.3 C  Resp: 18    Last Pain: There were no vitals filed for this visit.    Patients Stated Pain Goal: 3 ([id number])  Complications: No apparent anesthesia complications

## 2015-09-27 NOTE — Consult Note (Signed)
Patient Information    Patient Name Sex DOB SSN   Angie Martin Female [birth year] [id number]    Consult Note by Jalene Mullet, MD at 09/13/2015 11:59 PM    Author: Jalene Mullet, MD Service: Ophthalmology Author Type: Physician   Filed: 09/27/2015 8:16 PM Note Time: 09/13/2015 11:59 PM Status: Signed   Editor: Jalene Mullet, MD (Physician)     Expand All Collapse All   Angie Martin 09/27/2015   Ophthalmology Consultation   Reason for consultation: Left eye pain/foreign body sensation  HPI: Immediately after the procedure, the patient began rubbing/itching eyes and developed foreign body sensation in the left eye. She complained that the sensation was like sand in the left eye. She had been uncomfortable until topical anesthetic was applied. She has no other relevant eye history/trauma  Pertinent Medical History:  Active Ambulatory Problems   Diagnosis Date Noted  . History of TIA (transient ischemic attack) 08/17/2014  . Ocular migraine 08/17/2014  . White matter abnormality on MRI of brain 08/17/2014  . History of atrial septal defect repair 08/17/2014  . Chest pain 06/28/2015  . Dyslipidemia 06/28/2015  . Postoperative state 09/27/2015   Resolved Ambulatory Problems   Diagnosis Date Noted  . No Resolved Ambulatory Problems   Past Medical History  Diagnosis Date  . TIA (transient ischemic attack) 06/2008  . ASD (atrial septal defect)   . High cholesterol   . Fibroids   . Anxiety   . Depression     Pertinent Ophthalmic History: None   Current Eye Medications: none   ROS: noncontributory  Visual Fields: FTC OU   Pupils: Pharmacologically dilated at my direction before exam Equal, brisk, no APD   Near acuity: J2    J3  TA:  Normal to palpation   Dilation: Not dilated   External: OD: Normal  OS: Normal   Anterior segment exam: By penlight By slit lap   Conjunctiva: OD: Quiet  OS: 1-2+ injection  Cornea:   OD: Clear, no fluorescein stain   OS: Clear, central epithelial defect radiating superiorly  Anterior Chamber:  OD: Deep/quiet   OS: Deep/quiet   Iris:   OD: Normal   OS: Normal   Lens:   OD: Clear   OS: Clear   Optic disc: Deferred  Central retina--examined with indirect ophthalmoscope: deferred   Impression:  Left corneal abrasion with epithelial defect and mild injection with foreign body sensation  Recommendations/Plan: Erythromycin ointment and application of pressure patch to be kept in place until morning, then Erythromycin ointment QID until follow-up appointment in my clinic on Friday. Patient will call for appointment.  I've discussed these findings with the nurse and/or resident. Please contact our office with any questions or concerns at [phone]. Thank you for calling us to care for this woman .  Royston Cowper

## 2015-09-27 NOTE — Brief Op Note (Signed)
09/27/2015  9:43 AM  PATIENT:  Harvin Hazel  40 y.o. female  PRE-OPERATIVE DIAGNOSIS:  FIBROIDS, menorraghia  POST-OPERATIVE DIAGNOSIS:  FIBROIDS. menorrhagia  PROCEDURE:  Procedure(s): ROBOTIC ASSISTED TOTAL HYSTERECTOMY, Bilateral Salpingectomy (N/A) CYSTOSCOPY  SURGEON:  Surgeon(s) and Role:    * Bobbye Charleston, MD - Primary    * Jerelyn Charles, MD - Assisting  LOCAL MEDICATIONS USED:  BUPIVICAINE   SPECIMEN:  Source of Specimen:  uterus,cervix and tubes  DISPOSITION OF SPECIMEN:  PATHOLOGY  COUNTS:  YES  DICTATION: .Note written in EPIC  PLAN OF CARE: Admit for overnight observation  PATIENT DISPOSITION:  PACU - hemodynamically stable.   Delay start of Pharmacological VTE agent (>24hrs) due to surgical blood loss or risk of bleeding: not applicable  Complications:  None.  Findings:  11 weeks size uterus with a 6 cm anterior submucosal fibroid.  L ovary was normal.  R ovary had a small hemorrhagic cyst.  The ureters were identified during multiple points of the case and were always out of the field of dissection.  On cystoscopy, the bladder was intact and bilateral spill was seen from each ureteral orriface.  There was a small plaque of endometriosis in cul de sac, cauterized with hot shears.  Medications:  Ancef.  Bupivicaine.    Technique:  After adequate anesthesia was achieved the patient was positioned, prepped and draped in usual sterile fashion.  A speculum was placed in the vagina and the cervix dilated with pratt dilators.  The  8 cm Rumi and 3 cm Koh ring were assembled and placed in proper fashion.  The  Speculum was removed and the bladder catheterized with a foley.    Attention was turned to the abdomen where a 1 cm incision was made 1 cm above the umbilicus.  The veress needle was introduced without aspiration of bowel contents or blood and the abdomen insufflated. The long 12 mm trocar was placed and the other three trocar sites were marked out, all  approximately 10 cm from each other and the camera.  Two 8.5 mm trocars were placed on either side of the camera port and a 5 mm assistant port was placed 3 cm above the L the plane of the other two trocarst.  All trocars were inserted under direct visualization of the camera.  The patient was placed in trendelenburg and then the Robot docked.  The PK forceps were placed on arm 2 and the Hot shears on arm 1 and introduced under direct visualization of the camera.  I then broke scrub and sat down at the console.  The above findings were noted and the ureters identified well out of the field of dissection.  The right fallopian tube was isolated and cauterized with the PK.  The Utero-ovarian ligament was then divided with the PK cautery and shears.  The posterior broad ligament was then divided with the hot shears until the uterosacral ligament.  The Broad and cardinal ligaments were then cauterized against the cervix to the level of the Koh ring, securing the uterine artery.  Each pedicle was then incised with the shears.  The anterior leaf was then incised at the reflection of the vessico-uterine junction and the lateral bladder retracted inferiorly after the round ligament had been divided with the PK forceps.  The left tube was cauterized with the PK and divided with the shears;  then the left utero-ovarian ligament divided with the PK forceps and the scissors.  The round ligament was divided as well  and the posterior leaf of the broad ligament then divided with the hot shears. The broad and cardinal ligaments were then cauterized on the left in the same way.   At the level of the internal os, the uterine arteries were bilaterally cauterized with the PK.  The ureters were identified well out of the field of dissection.  .   The bladder was then able to be retracted inferiorly and the vesico-uterine fascia was incised in the midline until the bladder was removed one cm below the Koh ring.  The hot shears then  circumferentially incised the vagina at the level of the reflection on the [facility] ring.  Once the uterus and cervix were amputated, cautery was used to insure hemostasis of the cuff.  Once hemostasis was achieved, the instruments were changed to the mega needle driver and mega suture cut needle driver and the cuff was closed with a running stitches of 0-vicryl V loc.   Cautery was used to ensure hemostasis of the left pedicles very superficially.  The ureters were peristalsing bilaterally well and very lateral to the areas of operation.    The Robot was then undocked and I scrubbed back in.  The needle was removed and Bupivicaine was introduced into the pelvis.  The fascia of the 12 mm trocar was closed with a figure of 8 stitch of 0 vicryl.  The skin incisions were closed with subcuticular stitches of 3-0 vicryl Rapide and Dermabond.  All instruments were removed from the vagina and cystoscopy performed, revealing an intact bladder and vigourous spill of indigo carmine from each ureteral orifice.  The cystoscope was removed and the patient taken to the recovery room in stable condition.  Mysha Peeler A

## 2015-09-27 NOTE — Addendum Note (Signed)
Addendum  created 09/27/15 1122 by Lyn Hollingshead, MD   Modules edited: Orders, PRL Based Order Sets

## 2015-09-27 NOTE — Addendum Note (Signed)
Addendum  created 09/27/15 1220 by Lyn Hollingshead, MD   Modules edited: PRL Based Order Sets

## 2015-09-27 NOTE — Consult Note (Signed)
Shalinda Kuske                                                                               09/27/2015   Ophthalmology Consultation                                         Reason for consultation:  Left eye pain/foreign body sensation  HPI: Immediately after the procedure, the patient began rubbing/itching eyes and developed foreign body sensation in the left eye.  She complained that the sensation was like sand in the left eye.  She had been uncomfortable until topical anesthetic was applied.  She has no other relevant eye history/trauma  Pertinent Medical History:   Active Ambulatory Problems    Diagnosis Date Noted  . History of TIA (transient ischemic attack) 08/17/2014  . Ocular migraine 08/17/2014  . White matter abnormality on MRI of brain 08/17/2014  . History of atrial septal defect repair 08/17/2014  . Chest pain 06/28/2015  . Dyslipidemia 06/28/2015  . Postoperative state 09/27/2015   Resolved Ambulatory Problems    Diagnosis Date Noted  . No Resolved Ambulatory Problems   Past Medical History  Diagnosis Date  . TIA (transient ischemic attack) 06/2008  . ASD (atrial septal defect)   . High cholesterol   . Fibroids   . Anxiety   . Depression     Pertinent Ophthalmic History: None      Current Eye Medications: none   ROS: noncontributory  Visual Fields: FTC OU       Pupils:  Pharmacologically dilated at my direction before exam   Equal, brisk, no APD     Near acuity:     J2        J3  TA:        Normal to palpation   Dilation:  Not dilated   External:   OD:  Normal       OS:  Normal      Anterior segment exam:  By penlight   By slit lap      Conjunctiva:  OD:  Quiet      OS:  1-2+ injection  Cornea:    OD: Clear, no fluorescein stain       OS: Clear, central epithelial defect radiating superiorly  Anterior Chamber:   OD:  Deep/quiet      OS:  Deep/quiet     Iris:    OD:  Normal       OS:  Normal      Lens:    OD:  Clear         OS:   Clear          Optic disc: Deferred  Central retina--examined with indirect ophthalmoscope: deferred   Impression:  Left corneal abrasion with epithelial defect and mild injection with foreign body sensation   Recommendations/Plan:  Erythromycin ointment and application of pressure patch to be kept in place until morning, then Erythromycin ointment QID until follow-up appointment in my clinic on Friday.  Patient will call for appointment.  I've discussed these findings with the  nurse and/or resident. Please contact our office with any questions or concerns at [phone]. Thank you for calling us to care for this woman .  Royston Cowper

## 2015-09-27 NOTE — Anesthesia Procedure Notes (Signed)
Procedure Name: Intubation Date/Time: 09/27/2015 7:30 AM Performed by: Bufford Spikes Pre-anesthesia Checklist: Patient identified, Timeout performed, Emergency Drugs available, Suction available and Patient being monitored Patient Re-evaluated:Patient Re-evaluated prior to inductionOxygen Delivery Method: Circle system utilized Preoxygenation: Pre-oxygenation with 100% oxygen Intubation Type: IV induction Ventilation: Mask ventilation without difficulty Laryngoscope Size: Miller and 2 Grade View: Grade I Tube type: Oral Tube size: 7.0 mm Number of attempts: 1 Airway Equipment and Method: Stylet Placement Confirmation: ETT inserted through vocal cords under direct vision,  breath sounds checked- equal and bilateral and positive ETCO2 Secured at: 21 cm Tube secured with: Tape Dental Injury: Teeth and Oropharynx as per pre-operative assessment

## 2015-09-28 ENCOUNTER — Encounter (HOSPITAL_COMMUNITY): Payer: Self-pay | Admitting: Obstetrics and Gynecology

## 2015-09-28 DIAGNOSIS — D250 Submucous leiomyoma of uterus: Secondary | ICD-10-CM | POA: Diagnosis not present

## 2015-09-28 MED ORDER — ERYTHROMYCIN 5 MG/GM OP OINT
TOPICAL_OINTMENT | Freq: Four times a day (QID) | OPHTHALMIC | Status: DC
Start: 2015-09-28 — End: 2015-11-15

## 2015-09-28 MED ORDER — IBUPROFEN 800 MG PO TABS: 800.0000 mg | ORAL_TABLET | Freq: Three times a day (TID) | ORAL | Status: DC | PRN

## 2015-09-28 MED ORDER — OXYCODONE-ACETAMINOPHEN 5-325 MG PO TABS: 1.0000 | ORAL_TABLET | ORAL | Status: DC | PRN

## 2015-09-28 NOTE — Progress Notes (Signed)
Patient is eating, ambulating, and voiding.  Pain control is good.  BP 103/56 mmHg  Pulse 97  Temp(Src) 98.3 F (36.8 C) (Oral)  Resp 16  Ht 5' 8.75" (1.746 m)  Wt 94.121 kg (207 lb 8 oz)  BMI 30.87 kg/m2  SpO2 97%  LMP 09/12/2015 (Approximate)  lungs:   clear to auscultation cor:    RRR Abdomen:  soft, appropriate tenderness, incisions intact and without erythema or exudate. ex:    no cords   Lab Results  Component Value Date   WBC 6.6 09/13/2015   HGB 12.7 09/13/2015   HCT 38.6 09/13/2015   MCV 84.3 09/13/2015   PLT 137* 09/13/2015    A/P  Routine care.  Expect d/c per plan.  Pt seen by Dr. Posey Pronto of optho- eye is patched and using erythromycin ointment.  She will see him on Friday.   F/u with me in three weeks.  Percocet for pain.

## 2015-09-28 NOTE — Progress Notes (Signed)
Discharge instructions/paperwork reviewed with patient. Patient given prescriptions. Patient has no questions at this time.

## 2015-09-28 NOTE — Anesthesia Postprocedure Evaluation (Signed)
Anesthesia Post Note  Patient: Control and instrumentation engineer  Procedure(s) Performed: Procedure(s) (LRB): ROBOTIC ASSISTED TOTAL HYSTERECTOMY, Bilateral Salpingectomy (N/A) CYSTOSCOPY  Patient location during evaluation: [facility] Anesthesia Type: General Level of consciousness: awake and alert Pain management: pain level controlled Vital Signs Assessment: post-procedure vital signs reviewed and stable Respiratory status: spontaneous breathing Cardiovascular status: stable Postop Assessment: no signs of nausea or vomiting and adequate PO intake Anesthetic complications: no (intense itching of eyes postop causing corneal abrasion;tx by ophtholmologist last night;note made in chart regarding fentanyl for future)     Last Vitals:  Filed Vitals:   09/28/15 0201 09/28/15 0630  BP: 115/73 103/56  Pulse: 91 97  Temp: 36.4 C 36.8 C  Resp: 18 16    Last Pain:  Filed Vitals:   09/28/15 0749  PainSc: 0-No pain   Pain Goal: Patients Stated Pain Goal: 2 (09/28/15 0631)               Everette Rank

## 2015-09-28 NOTE — Discharge Summary (Signed)
Physician Discharge Summary  Patient ID: Angie Martin MRN: [id number] DOB/AGE: 04/26/1975 40 y.o.  Admit date: 09/27/2015 Discharge date: 09/28/2015  Admission Diagnoses:fibroids, menorrhagia  Discharge Diagnoses: same plus corneal abrasion Active Problems:   Postoperative state   Discharged Condition: good  Hospital Course: Uncomplicated robotic TLH/Salpingectomies/cysto.  Postoperatively pt had a severe itching reaction to fentanyl and was uncontrollably rubbing her eyes.  Optho came to see her and she did have a corneal abrasion; recommendations of optho followed.  Pt otherwise recovered from surgery well.   Consults: opthomalogy  Significant Diagnostic Studies: none  Treatments: surgery: Uncomplicated robotic TLH/Salpingectomies/cysto.   Discharge Exam: Blood pressure 103/56, pulse 97, temperature 98.3 F (36.8 C), temperature source Oral, resp. rate 16, height 5' 8.75" (1.746 m), weight 94.121 kg (207 lb 8 oz), last menstrual period 09/12/2015, SpO2 97 %.   Disposition: 01-Home or Self Care  Discharge Instructions    Call MD for:  persistant nausea and vomiting    Complete by:  As directed      Call MD for:  severe uncontrolled pain    Complete by:  As directed      Call MD for:  temperature >100.4    Complete by:  As directed      Diet - low sodium heart healthy    Complete by:  As directed      Discharge instructions    Complete by:  As directed   No driving on narcotics, no sexual activity for 2 weeks.     Increase activity slowly    Complete by:  As directed      May shower / Bathe    Complete by:  As directed   Shower, no bath for 2 weeks.     Remove dressing in 24 hours    Complete by:  As directed      Sexual Activity Restrictions    Complete by:  As directed   No sexual activity for 2 weeks.            Medication List    TAKE these medications        ALPRAZolam 1 MG tablet  Commonly known as:  XANAX  Take 1 mg by mouth 2 (two) times daily as  needed for anxiety.     erythromycin ophthalmic ointment  Place into the left eye 4 (four) times daily.     ibuprofen 800 MG tablet  Commonly known as:  ADVIL,MOTRIN  Take 1 tablet (800 mg total) by mouth every 8 (eight) hours as needed (mild pain).     OVER THE COUNTER MEDICATION  Take 2 capsules by mouth at bedtime as needed (for sleep). *Olley - sleep supplement*     oxyCODONE-acetaminophen 5-325 MG tablet  Commonly known as:  PERCOCET/ROXICET  Take 1-2 tablets by mouth every 4 (four) hours as needed for severe pain (moderate to severe pain (when tolerating fluids)).           Follow-up Information    Follow up with Kayonna Lawniczak A, MD.   Specialty:  Obstetrics and Gynecology   Contact information:   [address] [place] [address] [phone]       Signed: Chanise Habeck A 09/28/2015, 7:23 AM

## 2015-09-28 NOTE — Addendum Note (Signed)
Addendum  created 09/28/15 0806 by Georgeanne Nim, CRNA   Modules edited: Clinical Notes   Clinical Notes:  File: [id number]

## 2015-10-17 NOTE — H&P (Signed)
40 y.o. yo complains of Menorrhagia and bleeding during sex. Also having bladder pressure and frequency. Now a 5.5 cm fibroid seen pushing onto EM; also an area in EM that could be polyp. Had long discussion about options including Lupron, uterine artery embolization, D&C with hysteroscopy (novasure unlikely to help secondary fibroid), total laparoscopic hysterectomy with robot and retention of ovaries. D/w pt all risks and benefits of each. Also d/w pt that of all options, hysterectomy would be the only permanent solution but would likely be longest off work ( up to 6 weeks.)  Pt then underwent sonohystogram to see if she had polyp that could be causing menorrhaghia- results were thickenend endo only.  Pt decided on definitive therapy for menorrhaghia secondary symptomatic fibroids.   .  Past Medical History  Diagnosis Date  . TIA (transient ischemic attack) 06/2008    mild- surgery for ASD closure  . ASD (atrial septal defect)     repaired in 2010 at [facility] - 56mm Helix ASD Closure  . High cholesterol     diet controlled, no meds  . Ocular migraine     otc meds prn  . Fibroids   . 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 Depression     no meds - "eat pot cookies"   Past Surgical History  Procedure Laterality Date  . Tonsillectomy  1993  . Helix device  2010    at [facility] - 30 mm Helix ASD Closure  . Vein laser surgery  2016    bilateral legs  . Asd repair    . Bone spur removal Left 2001  . Wisdom tooth extraction    . Robotic assisted total hysterectomy N/A 09/27/2015    Procedure: ROBOTIC ASSISTED TOTAL HYSTERECTOMY, Bilateral Salpingectomy;  Surgeon: Bobbye Charleston, MD;  Location: [facility];  Service: Gynecology;  Laterality: N/A;  . Cystoscopy  09/27/2015    Procedure: CYSTOSCOPY;  Surgeon: Bobbye Charleston, MD;  Location: [facility];  Service: Gynecology;;    Social History   Social History  . Marital Status: Single    Spouse Name: N/A  . Number of  Children: 0  . Years of Education: college   Occupational History  . restaurantier Other    [employer]   Social History Main Topics  . Smoking status: Never Smoker   . Smokeless tobacco: Never Used  . Alcohol Use: 6.6 oz/week    9 Shots of liquor, 2 Standard drinks or equivalent per week  . Drug Use: Yes    Special: Marijuana     Comment: "eats pot cookies" last used 6/11/7  . Sexual Activity:    Partners: Female    Patent examiner Protection: None     Comment: Scientist, research (life sciences)   Other Topics Concern  . Not on file   Social History Narrative   Single.  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  No children.      epworth sleepiness scale = 4 (06/28/2015)    No current facility-administered medications on file prior to encounter.   Current Outpatient Prescriptions on File Prior to Encounter  Medication Sig Dispense Refill  . ALPRAZolam (XANAX) 1 MG tablet Take 1 mg by mouth 2 (two) times daily as needed for anxiety.    Marland Kitchen OVER THE COUNTER MEDICATION Take 2 capsules by mouth at bedtime as needed (for sleep). *Olley - sleep supplement*      Allergies  Allergen Reactions  . Fentanyl Itching    @  [id number]  Lungs: clear to ascultation Cor:  RRR Abdomen:  soft, nontender, nondistended. Ex:  no cords, erythema Pelvic: Vulva: no masses, no atrophy, no lesions  Vagina: no tenderness, no erythema, no abnormal vaginal discharge, no vesicle(s) or ulcers, no cystocele, no rectocele  Cervix: grossly normal, no discharge, no cervical motion tenderness  Uterus: slightly enlarged, normal shape, midline, no uterine prolapse, non-tender  Bladder/Urethra: normal meatus, no urethral discharge, no urethral mass, bladder non distended, Urethra well supported  Adnexa/Parametria: no parametrial tenderness, no parametrial mass, no adnexal tenderness, no ovarian mass     A:  Menorrhaghia, post SA bleeding  and bladder pressure secondary to symptomatic fibroid.   P: All risks, benefits and alternatives  d/w patient and she desires to proceed.  Patient has undergone a modified bowel prep and will receive preop antibiotics and SCDs during the operation.     Majid Mccravy A

## 2015-10-26 ENCOUNTER — Other Ambulatory Visit (HOSPITAL_COMMUNITY): Payer: Self-pay | Admitting: Obstetrics and Gynecology

## 2015-10-26 DIAGNOSIS — R0789 Other chest pain: Secondary | ICD-10-CM

## 2015-10-30 ENCOUNTER — Ambulatory Visit (HOSPITAL_COMMUNITY)
Admission: RE | Admit: 2015-10-30 | Discharge: 2015-10-30 | Disposition: A | Discharge status: Home / Self Care | Payer: BLUE CROSS/BLUE SHIELD | Source: Ambulatory Visit | Attending: Obstetrics and Gynecology | Admitting: Obstetrics and Gynecology

## 2015-10-30 DIAGNOSIS — R0789 Other chest pain: Secondary | ICD-10-CM | POA: Diagnosis present

## 2015-11-15 ENCOUNTER — Encounter: Payer: Self-pay | Admitting: Neurology

## 2015-11-15 ENCOUNTER — Ambulatory Visit (INDEPENDENT_AMBULATORY_CARE_PROVIDER_SITE_OTHER): Payer: BLUE CROSS/BLUE SHIELD | Admitting: Neurology

## 2015-11-15 VITALS — BP 116/68 | HR 64 | Ht 70.0 in | Wt 202.0 lb

## 2015-11-15 DIAGNOSIS — Z8673 Personal history of transient ischemic attack (TIA), and cerebral infarction without residual deficits: Secondary | ICD-10-CM

## 2015-11-15 DIAGNOSIS — Z9889 Other specified postprocedural states: Secondary | ICD-10-CM

## 2015-11-15 DIAGNOSIS — Z8774 Personal history of (corrected) congenital malformations of heart and circulatory system: Secondary | ICD-10-CM

## 2015-11-15 DIAGNOSIS — G43809 Other migraine, not intractable, without status migrainosus: Secondary | ICD-10-CM | POA: Diagnosis not present

## 2015-11-15 DIAGNOSIS — G43109 Migraine with aura, not intractable, without status migrainosus: Secondary | ICD-10-CM

## 2015-11-15 NOTE — Patient Instructions (Signed)
Follow up as needed

## 2015-11-15 NOTE — Progress Notes (Signed)
NEUROLOGY FOLLOW UP OFFICE NOTE  Angie Martin [id number]  HISTORY OF PRESENT ILLNESS: Angie Martin) Menting is a 40 year old right-handed woman with anxiety and history of TIA who follows up for ocular migraines.    UPDATE: No changes over the past year.  She still has occasional ocular migraines.  She feels well.  HISTORY: She was admitted to [facility] in March 2010 for episode of bilateral vision loss lasting about 15 minutes followed by feeling of disequilibrium and diaphoresis.  There was no associated headache or focal numbness or weakness.  MRI of the brain revealed small subcortical signal abnormality adjacent to the lateral ventricle.  She was found to have an atrial septal defect.  It was closed with helex device in April 2010.  She was told to take ASA 325mg  daily for one year.  She was given a diagnosis of TIA.  Following this event, she began to have episodes of visual disturbance.  These episodes consist of flashing lights and tunnel vision in both eyes.  They last 15-20 minutes.  There is no associated headache.  She had a normal eye exam.  They typically occur once a month, however she had frequent spells over the course of one week about 6 weeks ago.    She does have a history of migraines, consisting of headache, nausea and vomiting, which resolved after high school.  She never had auras before.  Family history on her mother's side include migraine.  Her maternal grandmother had ALS.  Her maternal great-grandmother had Alzheimer's dementia.  Cholesterol 253, HDL 7 and LDL 174.    In the electronic medical records, she had an MRI of the brain on 06/03/09 for evaluation of blurred vision, which showed FLAIR and T2 white matter foci adjacent to the atrial of the lateral ventricles, as would usually be seen with perinatal insult.   PAST MEDICAL HISTORY: Past Medical History:  Diagnosis Date  . 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 ASD (atrial septal  defect)    repaired in 2010 at [facility] - 45mm Helix ASD Closure  . Depression    no meds - "eat pot cookies"  . Fibroids   . High cholesterol    diet controlled, no meds  . Ocular migraine    otc meds prn  . TIA (transient ischemic attack) 06/2008   mild- surgery for ASD closure    MEDICATIONS: Current Outpatient Prescriptions on File Prior to Visit  Medication Sig Dispense Refill  . ALPRAZolam (XANAX) 1 MG tablet Take 1 mg by mouth 2 (two) times daily as needed for anxiety.    Marland Kitchen ibuprofen (ADVIL,MOTRIN) 800 MG tablet Take 1 tablet (800 mg total) by mouth every 8 (eight) hours as needed (mild pain). 30 tablet 0  . OVER THE COUNTER MEDICATION Take 2 capsules by mouth at bedtime as needed (for sleep). *Olley - sleep supplement*     No current facility-administered medications on file prior to visit.     ALLERGIES: Allergies  Allergen Reactions  . Fentanyl Itching    FAMILY HISTORY: Family History  Problem Relation Age of Onset  . Hypertension Mother   . Coronary artery disease Mother     MI  . Coronary artery disease Maternal Grandmother   . Hypertension Maternal Grandmother   . ALS Maternal Grandmother     SOCIAL HISTORY: Social History   Social History  . Marital status: Single    Spouse name: N/A  .  Number of children: 0  . Years of education: college   Occupational History  . restaurantier Other    [employer]   Social History Main Topics  . Smoking status: Never Smoker  . Smokeless tobacco: Never Used  . Alcohol use 6.6 oz/week    9 Shots of liquor, 2 Standard drinks or equivalent per week  . Drug use:     Types: Marijuana     Comment: "eats pot cookies" last used 6/11/7  . Sexual activity: Yes    Partners: Female    Birth control/ protection: None     Comment: Partner Gay Filler   Other Topics Concern  . Not on file   Social History Narrative   Single.  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  No children.      epworth sleepiness scale = 4 (06/28/2015)     REVIEW OF SYSTEMS: Constitutional: No fevers, chills, or sweats, no generalized fatigue, change in appetite Eyes: No visual changes, double vision, eye pain Ear, nose and throat: No hearing loss, ear pain, nasal congestion, sore throat Cardiovascular: No chest pain, palpitations Respiratory:  No shortness of breath at rest or with exertion, wheezes GastrointestinaI: No nausea, vomiting, diarrhea, abdominal pain, fecal incontinence Genitourinary:  No dysuria, urinary retention or frequency Musculoskeletal:  No neck pain, back pain Integumentary: No rash, pruritus, skin lesions Neurological: as above Psychiatric: No depression, insomnia, anxiety Endocrine: No palpitations, fatigue, diaphoresis, mood swings, change in appetite, change in weight, increased thirst Hematologic/Lymphatic:  No purpura, petechiae. Allergic/Immunologic: no itchy/runny eyes, nasal congestion, recent allergic reactions, rashes  PHYSICAL EXAM: Vitals:   11/15/15 0901  BP: 116/68  Pulse: 64   General: No acute distress.  Patient appears well-groomed.  normal body habitus. Head:  Normocephalic/atraumatic Eyes:  Fundi examined but not visualized Neck: supple, no paraspinal tenderness, full range of motion Heart:  Regular rate and rhythm Lungs:  Clear to auscultation bilaterally Back: No paraspinal tenderness Neurological Exam: alert and oriented to person, place, and time. Attention span and concentration intact, recent and remote memory intact, fund of knowledge intact.  Speech fluent and not dysarthric, language intact.  CN II-XII intact. Bulk and tone normal, muscle strength 5/5 throughout.  Sensation to light touch, temperature and vibration intact.  Deep tendon reflexes 2+ throughout, toes downgoing.  Finger to nose and heel to shin testing intact.  Gait normal, Romberg negative.  IMPRESSION: 1.  Ocular migraines.  Since they are not debilitation, without headache and occur only once a month, no management  needed. 2.  Abnormal MRI of brain.  White matter abnormality likely not clinically relevant.  As per radiologist, finding is consistent with a perinatal insult.    3.  History of TIA.  Possibly cardioembolic from her atrial septal defect that traveled to the basilar artery. 4.  History of atrial septal defect closure  PLAN: Stable.  She may follow up as needed.  15 minutes spent face to face with patient, over 50% spent counseling.  Metta Clines, DO  CC:  Donald Prose, MD

## 2015-11-15 NOTE — Progress Notes (Signed)
Chart forwarded.  

## 2017-02-07 ENCOUNTER — Emergency Department (HOSPITAL_COMMUNITY): Payer: BLUE CROSS/BLUE SHIELD

## 2017-02-07 ENCOUNTER — Other Ambulatory Visit: Payer: Self-pay

## 2017-02-07 ENCOUNTER — Emergency Department (HOSPITAL_COMMUNITY)
Admission: EM | Admit: 2017-02-07 | Discharge: 2017-02-07 | Disposition: A | Discharge status: Home / Self Care | Payer: BLUE CROSS/BLUE SHIELD | Attending: Emergency Medicine | Admitting: Emergency Medicine

## 2017-02-07 ENCOUNTER — Encounter (HOSPITAL_COMMUNITY): Payer: Self-pay | Admitting: Emergency Medicine

## 2017-02-07 DIAGNOSIS — R079 Chest pain, unspecified: Secondary | ICD-10-CM

## 2017-02-07 DIAGNOSIS — Z79899 Other long term (current) drug therapy: Secondary | ICD-10-CM | POA: Insufficient documentation

## 2017-02-07 LAB — CBC
HCT: 41.5 % (ref 36.0–46.0)
Hemoglobin: 13.9 g/dL (ref 12.0–15.0)
MCH: 30.1 pg (ref 26.0–34.0)
MCHC: 33.5 g/dL (ref 30.0–36.0)
MCV: 89.8 fL (ref 78.0–100.0)
Platelets: 180 10*3/uL (ref 150–400)
RBC: 4.62 MIL/uL (ref 3.87–5.11)
RDW: 13.2 % (ref 11.5–15.5)
WBC: 8.1 10*3/uL (ref 4.0–10.5)

## 2017-02-07 LAB — BASIC METABOLIC PANEL
Anion gap: 9 (ref 5–15)
BUN: 7 mg/dL (ref 6–20)
CO2: 26 mmol/L (ref 22–32)
Calcium: 9.1 mg/dL (ref 8.9–10.3)
Chloride: 106 mmol/L (ref 101–111)
Creatinine, Ser: 0.63 mg/dL (ref 0.44–1.00)
GFR calc Af Amer: >60 mL/min (ref >60)
GFR calc non Af Amer: >60 mL/min (ref >60)
Glucose, Bld: 101 mg/dL — ABNORMAL HIGH (ref 65–99)
Potassium: 3.6 mmol/L (ref 3.5–5.1)
Sodium: 141 mmol/L (ref 135–145)

## 2017-02-07 LAB — I-STAT TROPONIN, ED
Troponin i, poc: 0 ng/mL (ref 0.00–0.08)
Troponin i, poc: 0 ng/mL (ref 0.00–0.08)

## 2017-02-07 NOTE — ED Notes (Signed)
ED Provider at bedside. 

## 2017-02-07 NOTE — ED Triage Notes (Signed)
Pt verbalizes tingling to extremities for past 2 days; thought was stress related so took anxiety medication. Pt concerned today related to new onset chest pain radiating down left arm; onset just PTA; hx of TIA. Pt appears anxious in triage.

## 2017-02-07 NOTE — ED Provider Notes (Addendum)
[facility] DEPT Provider Note   CSN: [id number] Arrival date & time: 02/07/17  0741     History   Chief Complaint Chief Complaint  Patient presents with  . Chest Pain    HPI Angie Martin is a 41 y.o. female.  Patient is a 41 year old female with a history of TIA status post ASD closure, anxiety, depression, dyslipidemia who is presenting today with sudden onset of chest pain that started approximately 745 this morning and lasted for 30 minutes.  She described it as an intense shooting pain that went into the middle to left side of her chest radiating between her back and down her left arm.  She denies any shortness of breath, diaphoresis or nausea associated with the symptoms.  It resolved spontaneously.  She denies any GERD type symptoms over the last few days but does admit to being severely stressed.  Patient has a history of present anxiety and takes Xanax regularly sometimes 4-5 times per day.  She has been on Xanax for years but states last week she ran out.  She was starting to have tingling of her bilateral hands, tongue, blurry vision, intermittent vomiting and so finally went and got her prescription filled yesterday and after taking the Xanax though symptoms resolved.  However this morning was the first time she had had pain in her chest.  She does have a family history of her mother and grandmother having MIs in their late 67s and early 53s and does not know much about her dad side of the family.  None of her siblings have had heart issues.  She denies alcohol, tobacco or drug use.  She had no abdominal pain today.  No recent travel, OCPs or unilateral leg pain or swelling.   The history is provided by the patient.    Past Medical History:  Diagnosis Date  . 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 ASD (atrial septal defect)    repaired in 2010 at [place] - 21mm Helix ASD Closure  . Depression    no meds - "eat pot  cookies"  . Fibroids   . High cholesterol    diet controlled, no meds  . Ocular migraine    otc meds prn  . TIA (transient ischemic attack) 06/2008   mild- surgery for ASD closure    Patient Active Problem List   Diagnosis Date Noted  . Postoperative state 09/27/2015  . Corneal abrasion, left 09/27/2015  . Chest pain 06/28/2015  . Dyslipidemia 06/28/2015  . History of TIA (transient ischemic attack) 08/17/2014  . Ocular migraine 08/17/2014  . White matter abnormality on MRI of brain 08/17/2014  . History of atrial septal defect repair 08/17/2014    Past Surgical History:  Procedure Laterality Date  . ASD REPAIR    . bone spur removal Left 2001  . Helix device  2010   at [facility] - 30 mm Helix ASD Closure  . TONSILLECTOMY  1993  . vein laser surgery  2016   bilateral legs  . WISDOM TOOTH EXTRACTION      OB History    No data available       Home Medications    Prior to Admission medications   Medication Sig Start Date End Date Taking? Authorizing Provider  ALPRAZolam Duanne Moron) 1 MG tablet Take 1 mg 3 (three) times daily as needed by mouth for anxiety.    Yes [provider]  Nutritional Supplements (JUICE  PLUS FIBRE PO) Take 6 capsules daily by mouth.   Yes [provider]  ibuprofen (ADVIL,MOTRIN) 800 MG tablet Take 1 tablet (800 mg total) by mouth every 8 (eight) hours as needed (mild pain). Patient not taking: Reported on 02/07/2017 09/28/15   Bobbye Charleston, MD  OVER THE COUNTER MEDICATION Take 2 capsules by mouth at bedtime as needed (for sleep). *[place] - sleep supplement*    [provider]    Family History Family History  Problem Relation Age of Onset  . Hypertension Mother   . Coronary artery disease Mother        MI  . Coronary artery disease Maternal Grandmother   . Hypertension Maternal Grandmother   . ALS Maternal Grandmother     Social History Social History   Tobacco Use  . Smoking status: Never Smoker  . Smokeless  tobacco: Never Used  Substance Use Topics  . Alcohol use: Yes    Alcohol/week: 6.6 oz    Types: 9 Shots of liquor, 2 Standard drinks or equivalent per week  . Drug use: Yes    Types: Marijuana    Comment: "eats pot cookies" last used 6/11/7     Allergies   Fentanyl   Review of Systems Review of Systems  All other systems reviewed and are negative.    Physical Exam Updated Vital Signs BP 135/82   Pulse 71   Temp 98 F (36.7 C) (Oral)   Resp 19   Ht 5\' 10"  (1.778 m)   Wt 82.6 kg (182 lb)   LMP 09/12/2015 (Approximate)   SpO2 100%   BMI 26.11 kg/m   Physical Exam  Constitutional: She is oriented to person, place, and time. She appears well-developed and well-nourished. No distress.  Slightly anxious on exam  HENT:  Head: Normocephalic and atraumatic.  Mouth/Throat: Oropharynx is clear and moist.  Eyes: Conjunctivae and EOM are normal. Pupils are equal, round, and reactive to light.  Neck: Normal range of motion. Neck supple.  Cardiovascular: Normal rate, regular rhythm and intact distal pulses.  No murmur heard. Pulses:      Radial pulses are 2+ on the right side, and 2+ on the left side.  Pulmonary/Chest: Effort normal and breath sounds normal. No respiratory distress. She has no decreased breath sounds. She has no wheezes. She has no rales.  Abdominal: Soft. She exhibits no distension. There is no tenderness. There is no rebound and no guarding.  Musculoskeletal: Normal range of motion. She exhibits no edema or tenderness.       Right lower leg: She exhibits no tenderness and no edema.       Left lower leg: She exhibits no tenderness and no edema.  Neurological: She is alert and oriented to person, place, and time.  Skin: Skin is warm and dry. Capillary refill takes less than 2 seconds. No rash noted. No erythema.  Psychiatric: She has a normal mood and affect. Her behavior is normal.  Nursing note and vitals reviewed.    ED Treatments / Results  Labs (all  labs ordered are listed, but only abnormal results are displayed) Labs Reviewed  BASIC METABOLIC PANEL - Abnormal; Notable for the following components:      Result Value   Glucose, Bld 101 (*)    All other components within normal limits  CBC  I-STAT TROPONIN, ED  I-STAT TROPONIN, ED    EKG  EKG Interpretation None      ED ECG REPORT   Date: 02/07/2017  Rate: 94  Rhythm: normal sinus rhythm  QRS Axis: normal  Intervals: normal  ST/T Wave abnormalities: normal  Conduction Disutrbances:none  Narrative Interpretation:   Old EKG Reviewed: unchanged  I have personally reviewed the EKG tracing and agree with the computerized printout as noted.  Radiology Dg Chest 2 View  Result Date: 02/07/2017 CLINICAL DATA:  Sudden onset lt sided chest pain through to back since this a.m, nonsmoker, states no prev hx of heart or lung disease EXAM: CHEST  2 VIEW COMPARISON:  06/12/2015 FINDINGS: ASD closure device best imaged on the lateral projection. Heart size is normal. Lungs are free of focal consolidations and pleural effusions. No pulmonary edema. IMPRESSION: No evidence for acute cardio pulmonary abnormality. Electronically Signed   By: Nolon Nations M.D.   On: 02/07/2017 08:26    Procedures Procedures (including critical care time)  Medications Ordered in ED Medications - No data to display   Initial Impression / Assessment and Plan / ED Course  I have reviewed the triage vital signs and the nursing notes.  Pertinent labs & imaging results that were available during my care of the patient were reviewed by me and considered in my medical decision making (see chart for details).     Patient presenting with atypical chest pain in the setting of anxiety that lasted between 20-30 minutes. PERC neg, Heart Score of 1.  Low suspicion for dissection as patient has equal pulses no ongoing pain and normal vital signs.  Low suspicion for MI, PE.  Patient notes she was having pain when  EKG was done in the department but resolved shortly after.  Patient's EKG while having pain showed no evidence of MI.  Her initial troponin, CBC, BMP are all within normal limits.  Patient denies any pain currently. We will recheck troponin at 11:00 which would be 3 hours after pain started.  12:01 PM Delta trop neg.  No return of pain.  Discussed with patient not stopping the Xanax abruptly.  Also discussed with her speaking with her primary provider for a possible antianxiety medication.  Patient used to be on Lexapro and seemed to be able to not use Xanax as much.  Final Clinical Impressions(s) / ED Diagnoses   Final diagnoses:  Nonspecific chest pain    ED Discharge Orders    None       Blanchie Dessert, MD 02/07/17 7903    Blanchie Dessert, MD 02/07/17 1202

## 2017-03-13 DIAGNOSIS — F3341 Major depressive disorder, recurrent, in partial remission: Secondary | ICD-10-CM | POA: Diagnosis not present

## 2017-04-21 IMAGING — US US ABDOMEN LIMITED
1 series · 14 of 25 positions shown · non-contrast
Comparison: None.

CLINICAL DATA: Epigastric pain for 5 day

EXAM:
US ABDOMEN LIMITED - RIGHT UPPER QUADRANT

[Series 1: us abdomen limited · 0.22mm/px · 14 of 42 slices shown]
[im 1/42]
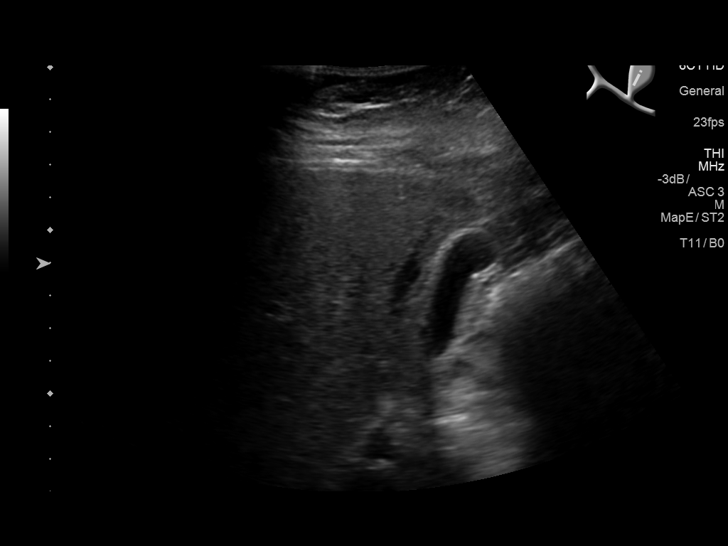
[im 4/42]
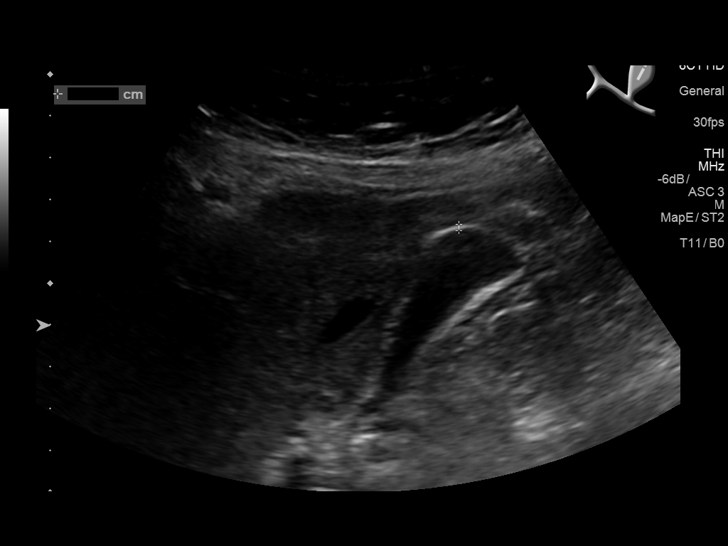
[im 7/42]
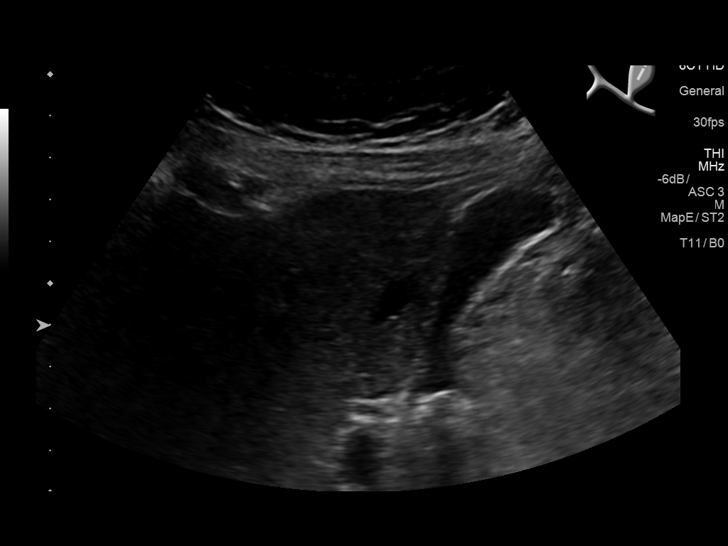
[im 11/42]
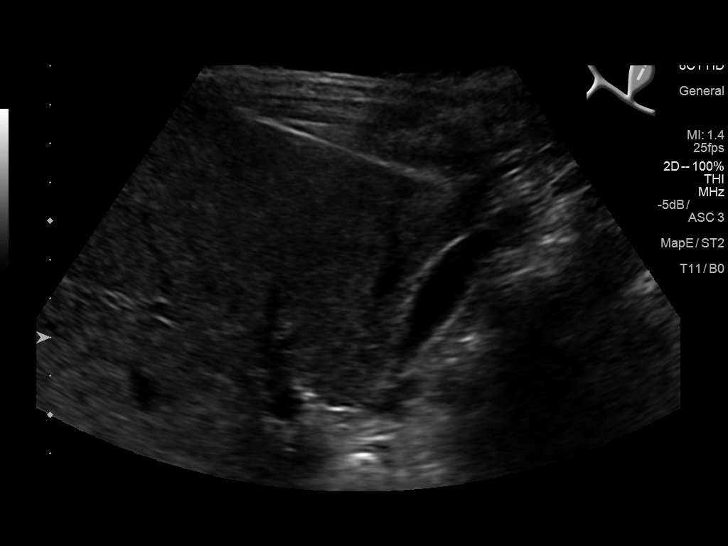
[im 14/42]
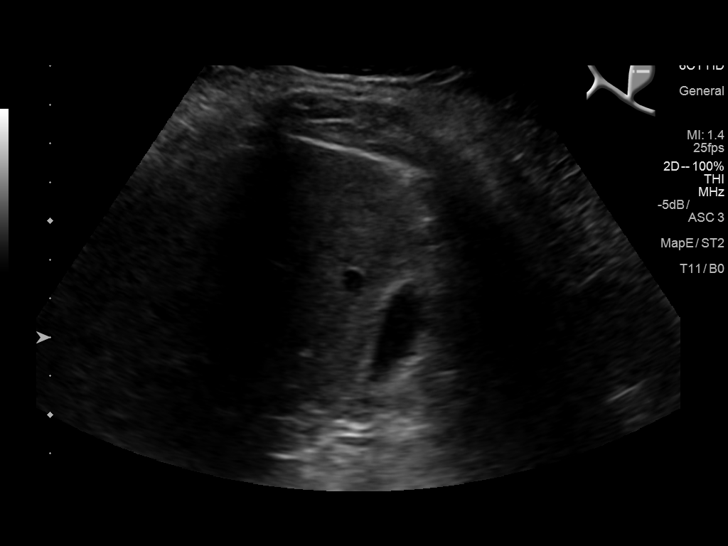
[im 16/42]
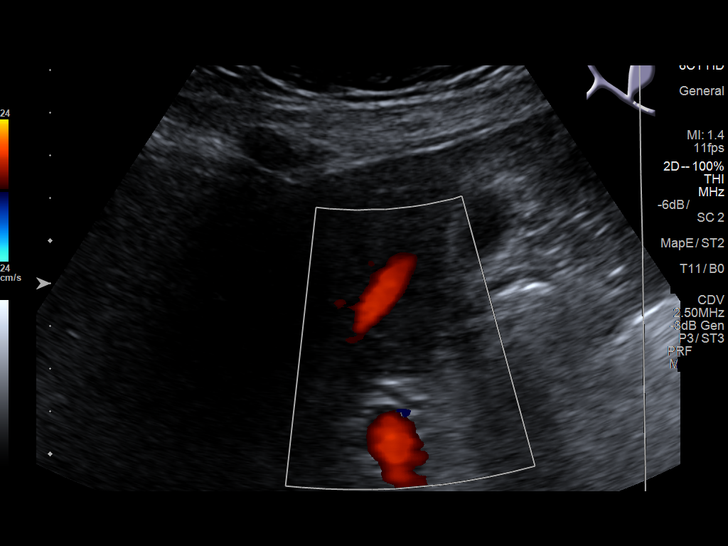
[im 19/42]
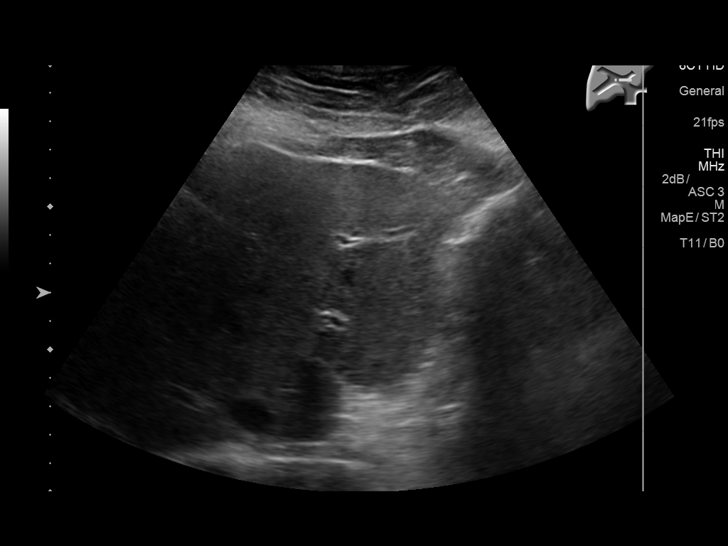
[im 23/42]
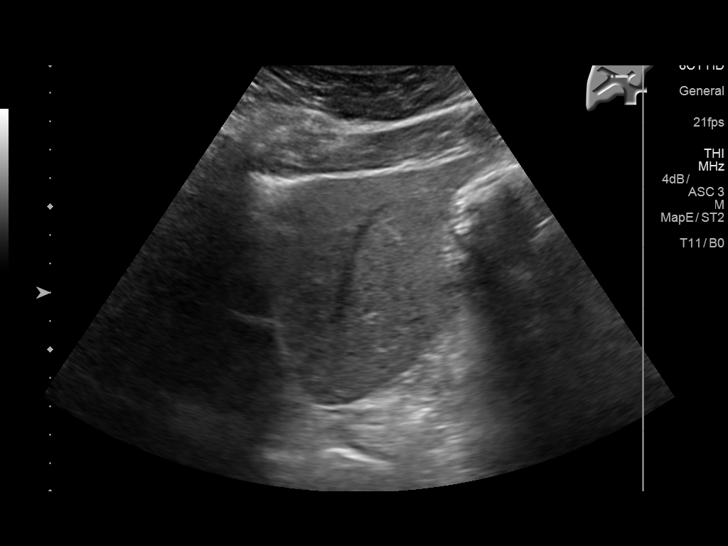
[im 26/42]
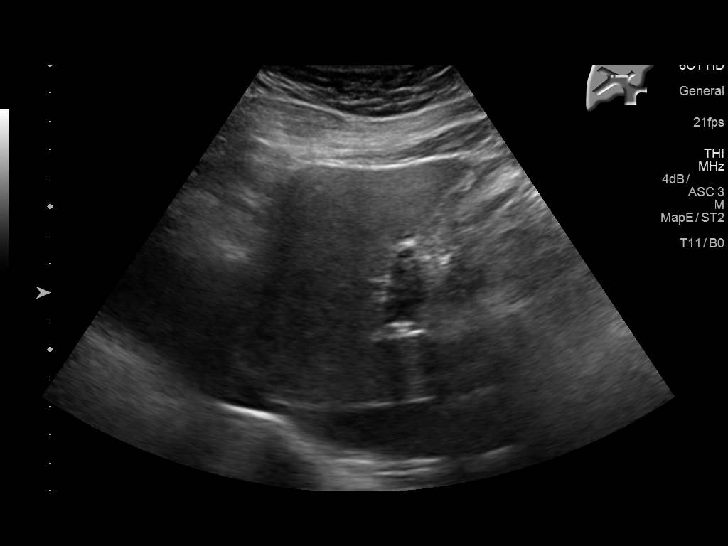
[im 28/42]
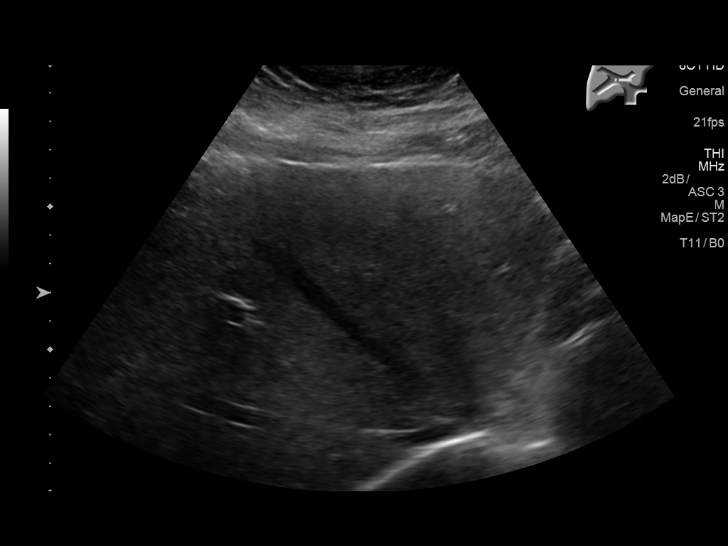
[im 31/42]
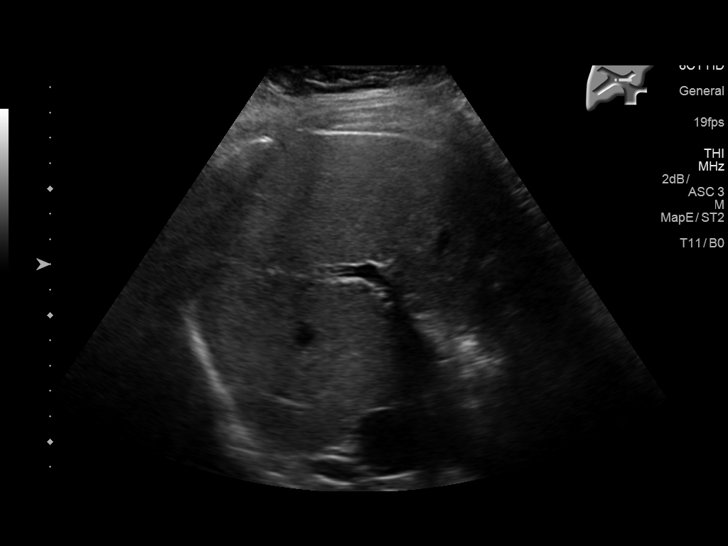
[im 35/42]
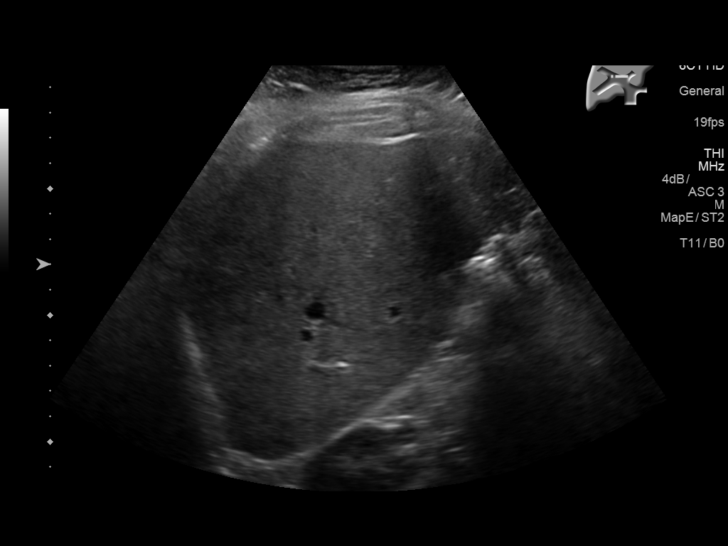
[im 38/42]
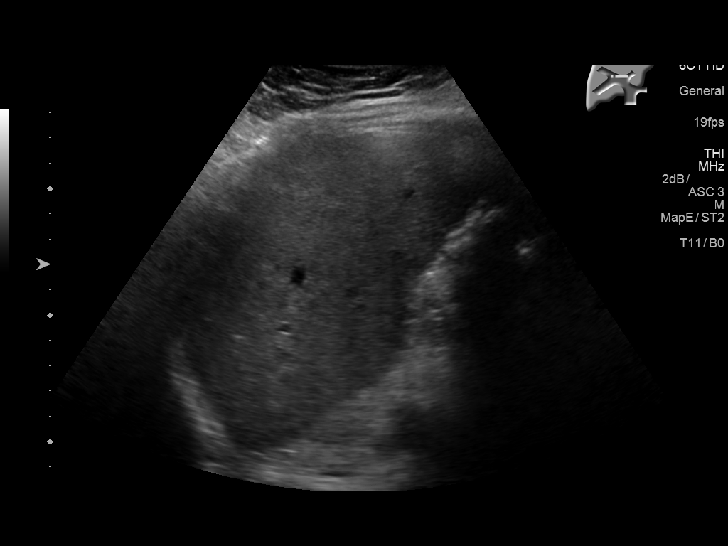
[im 42/42]
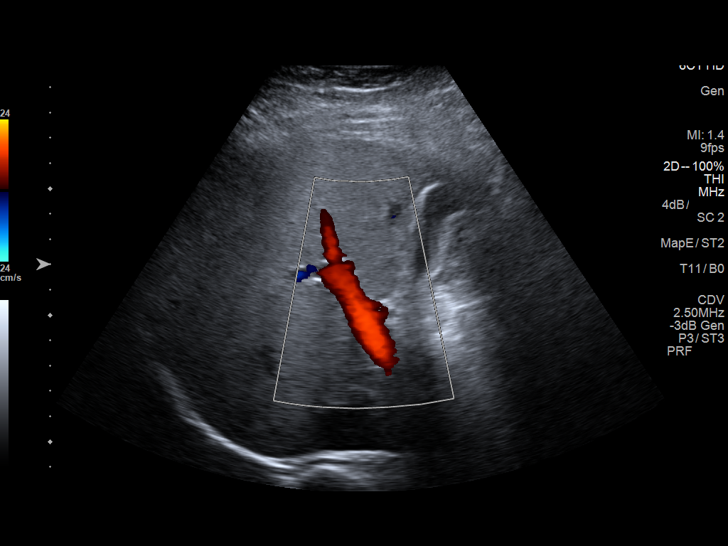

[14 of 25 positions shown; findings below may reference images not displayed]

FINDINGS: Gallbladder:

Gallbladder is contracted. The patient recently ate. No obvious
stones or wall thickening. No Murphy's sign.

Common bile duct:

Diameter: 2.6 mm

Liver:

No focal lesion identified. Within normal limits in parenchymal
echogenicity.
IMPRESSION: Limited examination of the gallbladder as described above.
Otherwise, within normal limits.

## 2017-05-01 DIAGNOSIS — Z1231 Encounter for screening mammogram for malignant neoplasm of breast: Secondary | ICD-10-CM | POA: Diagnosis not present

## 2017-05-01 DIAGNOSIS — L820 Inflamed seborrheic keratosis: Secondary | ICD-10-CM | POA: Diagnosis not present

## 2017-05-01 DIAGNOSIS — Z124 Encounter for screening for malignant neoplasm of cervix: Secondary | ICD-10-CM | POA: Diagnosis not present

## 2017-05-01 DIAGNOSIS — Z01419 Encounter for gynecological examination (general) (routine) without abnormal findings: Secondary | ICD-10-CM | POA: Diagnosis not present

## 2017-05-01 DIAGNOSIS — R87620 Atypical squamous cells of undetermined significance on cytologic smear of vagina (ASC-US): Secondary | ICD-10-CM | POA: Diagnosis not present

## 2017-05-01 DIAGNOSIS — Z6827 Body mass index (BMI) 27.0-27.9, adult: Secondary | ICD-10-CM | POA: Diagnosis not present

## 2017-05-02 DIAGNOSIS — R898 Other abnormal findings in specimens from other organs, systems and tissues: Secondary | ICD-10-CM | POA: Diagnosis not present

## 2017-08-01 DIAGNOSIS — F3341 Major depressive disorder, recurrent, in partial remission: Secondary | ICD-10-CM | POA: Diagnosis not present

## 2017-08-19 DIAGNOSIS — R898 Other abnormal findings in specimens from other organs, systems and tissues: Secondary | ICD-10-CM | POA: Diagnosis not present

## 2017-09-01 DIAGNOSIS — F3341 Major depressive disorder, recurrent, in partial remission: Secondary | ICD-10-CM | POA: Diagnosis not present

## 2017-09-02 DIAGNOSIS — F4323 Adjustment disorder with mixed anxiety and depressed mood: Secondary | ICD-10-CM | POA: Diagnosis not present

## 2017-09-09 DIAGNOSIS — F4323 Adjustment disorder with mixed anxiety and depressed mood: Secondary | ICD-10-CM | POA: Diagnosis not present

## 2017-09-24 ENCOUNTER — Encounter (HOSPITAL_COMMUNITY): Payer: Self-pay | Admitting: Emergency Medicine

## 2017-09-24 ENCOUNTER — Ambulatory Visit (HOSPITAL_COMMUNITY)
Admission: EM | Admit: 2017-09-24 | Discharge: 2017-09-24 | Disposition: A | Discharge status: Home / Self Care | Payer: BLUE CROSS/BLUE SHIELD | Attending: Family Medicine | Admitting: Family Medicine

## 2017-09-24 DIAGNOSIS — W2209XA Striking against other stationary object, initial encounter: Secondary | ICD-10-CM | POA: Diagnosis not present

## 2017-09-24 DIAGNOSIS — S060X0A Concussion without loss of consciousness, initial encounter: Secondary | ICD-10-CM | POA: Diagnosis not present

## 2017-09-24 DIAGNOSIS — S0990XA Unspecified injury of head, initial encounter: Secondary | ICD-10-CM | POA: Diagnosis not present

## 2017-09-24 NOTE — ED Provider Notes (Addendum)
[facility]    CSN: [id number] Arrival date & time: 09/24/17  1157     History   Chief Complaint Chief Complaint  Patient presents with  . Head Injury    HPI Angie Martin is a 42 y.o. female.   42 year old female with history of ASD, TIA, HTN comes in for 2 day history of confusion, headache, poor concentration. She hit her head 6 days ago on a cabinet when standing up. Denies loss of consciousness. States had some headache, but without other symptoms. States starting 2-3 days ago, started having some dizziness, fogginess, and poor concentration. She has been told she isn't making sense, and has been more forgetful during conversations. Had nausea without vomiting 2 days ago that has since improved. States tried to watch TV/read and had worsening symptoms. Denies weakness, lightheadedness. Still was able to keep active, though decreased intensity. Ibuprofen with temporary relief.   Patient with history of TIA at age 34, thought to be due to ASD. ASD repaired and has not had problems since.      Past Medical History:  Diagnosis Date  . 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 ASD (atrial septal defect)    repaired in 2010 at [place] - 8mm Helix ASD Closure  . Depression    no meds - "eat pot cookies"  . Fibroids   . High cholesterol    diet controlled, no meds  . Ocular migraine    otc meds prn  . TIA (transient ischemic attack) 06/2008   mild- surgery for ASD closure    Patient Active Problem List   Diagnosis Date Noted  . Postoperative state 09/27/2015  . Corneal abrasion, left 09/27/2015  . Chest pain 06/28/2015  . Dyslipidemia 06/28/2015  . History of TIA (transient ischemic attack) 08/17/2014  . Ocular migraine 08/17/2014  . White matter abnormality on MRI of brain 08/17/2014  . History of atrial septal defect repair 08/17/2014    Past Surgical History:  Procedure Laterality Date  . ASD REPAIR    . bone spur removal  Left 2001  . CYSTOSCOPY  09/27/2015   Procedure: CYSTOSCOPY;  Surgeon: Bobbye Charleston, MD;  Location: [facility];  Service: Gynecology;;  . Helix device  2010   at [place] - 30 mm Helix ASD Closure  . ROBOTIC ASSISTED TOTAL HYSTERECTOMY N/A 09/27/2015   Procedure: ROBOTIC ASSISTED TOTAL HYSTERECTOMY, Bilateral Salpingectomy;  Surgeon: Bobbye Charleston, MD;  Location: [facility];  Service: Gynecology;  Laterality: N/A;  . TONSILLECTOMY  1993  . vein laser surgery  2016   bilateral legs  . WISDOM TOOTH EXTRACTION      OB History   None      Home Medications    Prior to Admission medications   Medication Sig Start Date End Date Taking? Authorizing Provider  ALPRAZolam Duanne Moron) 1 MG tablet Take 1 mg 3 (three) times daily as needed by mouth for anxiety.     [provider]  ibuprofen (ADVIL,MOTRIN) 800 MG tablet Take 1 tablet (800 mg total) by mouth every 8 (eight) hours as needed (mild pain). Patient not taking: Reported on 02/07/2017 09/28/15   Bobbye Charleston, MD  Nutritional Supplements (JUICE PLUS FIBRE PO) Take 6 capsules daily by mouth.    [provider]  OVER THE COUNTER MEDICATION Take 2 capsules by mouth at bedtime as needed (for sleep). *[place] - sleep supplement*    [provider]    Family  History Family History  Problem Relation Age of Onset  . Hypertension Mother   . Coronary artery disease Mother        MI  . Coronary artery disease Maternal Grandmother   . Hypertension Maternal Grandmother   . ALS Maternal Grandmother     Social History Social History   Tobacco Use  . Smoking status: Never Smoker  . Smokeless tobacco: Never Used  Substance Use Topics  . Alcohol use: Yes    Alcohol/week: 6.6 oz    Types: 9 Shots of liquor, 2 Standard drinks or equivalent per week  . Drug use: Yes    Types: Marijuana    Comment: "eats pot cookies" last used 6/11/7     Allergies   Fentanyl   Review of Systems Review of Systems  Constitutional:  Positive for fatigue. Negative for diaphoresis and fever.  Respiratory: Negative for shortness of breath.   Cardiovascular: Negative for chest pain and palpitations.  Neurological: Positive for dizziness and headaches. Negative for tremors, seizures, syncope, facial asymmetry, speech difficulty, weakness, light-headedness and numbness.  Psychiatric/Behavioral: Positive for decreased concentration. Negative for agitation, confusion and hallucinations.     Physical Exam Triage Vital Signs ED Triage Vitals [09/24/17 1216]  Enc Vitals Group     BP 111/71     Pulse Rate (!) 57     Resp 18     Temp 98.2 F (36.8 C)     Temp src      SpO2 98 %     Weight      Height      Head Circumference      Peak Flow      Pain Score      Pain Loc      Pain Edu?      Excl. in [place]?    No data found.  Updated Vital Signs BP 111/71   Pulse (!) 57   Temp 98.2 F (36.8 C)   Resp 18   LMP 09/12/2015 (Approximate)   SpO2 98%   Physical Exam  Constitutional: She is oriented to person, place, and time. She appears well-developed and well-nourished. No distress.  HENT:  Head: Normocephalic and atraumatic.  Eyes: Pupils are equal, round, and reactive to light. Conjunctivae and EOM are normal.  Neck: Normal range of motion. Neck supple.  Cardiovascular: Normal rate, regular rhythm and normal heart sounds. Exam reveals no gallop and no friction rub.  No murmur heard. Pulmonary/Chest: Effort normal and breath sounds normal. She has no wheezes. She has no rales.  Neurological: She is alert and oriented to person, place, and time. She has normal strength. She is not disoriented. No cranial nerve deficit or sensory deficit. She displays a negative Romberg sign. Coordination and gait normal. GCS eye subscore is 4. GCS verbal subscore is 5. GCS motor subscore is 6.  Normal finger-to-nose, shin to heel, rapid movement.  Patient able to ambulate, get on and off exam table without problems.  Able to relay story  without difficulty or hesitancy.  Skin: Skin is warm and dry.  Psychiatric: She has a normal mood and affect. Her speech is normal and behavior is normal. Judgment and thought content normal. She is not actively hallucinating. Cognition and memory are normal.     UC Treatments / Results  Labs (all labs ordered are listed, but only abnormal results are displayed) Labs Reviewed - No data to display  EKG None  Radiology No results found.  Procedures Procedures (including critical care  time)  Medications Ordered in UC Medications - No data to display  Initial Impression / Assessment and Plan / UC Course  I have reviewed the triage vital signs and the nursing notes.  Pertinent labs & imaging results that were available during my care of the patient were reviewed by me and considered in my medical decision making (see chart for details).    No alarming signs on exam. Discussed possible concussion causing symptoms. Mental and physical rest. Tylenol/motrin for pain. Will have patient follow up with concussion clinic for further evaluation and management needed. Return precautions given. Patient expresses understanding and agrees to plan.  Final Clinical Impressions(s) / UC Diagnoses   Final diagnoses:  Injury of head, initial encounter  Concussion without loss of consciousness, initial encounter    ED Prescriptions    None        Arturo Morton 09/24/17 1252    Cathlean Sauer V, PA-C 09/24/17 1253

## 2017-09-24 NOTE — Discharge Instructions (Signed)
No alarming signs on exam. Your neurology exam was normal. Symptoms most likely from concussion. Mental and physical rest will be key. Staying home without Tv/music/reading. Tylenol/motrin for pain. Follow up with concussion clinic, make first available appointment. If experiencing worsening symptoms, vomiting, lethargy, weakness, dizziness, altered mental status, go to the emergency department for further evaluation needed.

## 2017-09-24 NOTE — ED Triage Notes (Signed)
Pt states she was standing up and hit the top of her head on an open cabinet, states she felt fine afterwards but then on Monday started to feel dizzy and having poor concentration.

## 2017-09-25 DIAGNOSIS — F4323 Adjustment disorder with mixed anxiety and depressed mood: Secondary | ICD-10-CM | POA: Diagnosis not present

## 2017-09-29 ENCOUNTER — Ambulatory Visit: Payer: BLUE CROSS/BLUE SHIELD | Admitting: Family Medicine

## 2017-09-29 ENCOUNTER — Encounter: Payer: Self-pay | Admitting: Family Medicine

## 2017-09-29 ENCOUNTER — Telehealth: Payer: Self-pay

## 2017-09-29 DIAGNOSIS — S060X0A Concussion without loss of consciousness, initial encounter: Secondary | ICD-10-CM

## 2017-09-29 DIAGNOSIS — S060X9A Concussion with loss of consciousness of unspecified duration, initial encounter: Secondary | ICD-10-CM | POA: Insufficient documentation

## 2017-09-29 DIAGNOSIS — S060XAA Concussion with loss of consciousness status unknown, initial encounter: Secondary | ICD-10-CM | POA: Insufficient documentation

## 2017-09-29 NOTE — Assessment & Plan Note (Signed)
Patient likely does have more of a mild concussion that seems to be almost completely resolved at this time.  I do believe that underlying anxiety is contributing.  Patient has had difficulty with a TIA previously and an MRI of the head did show white matter changes.  Do not feel that imaging is necessary at this moment but patient is incredibly anxious.  Concerned though that the head injury could cause patients to have more ocular migraines and is likely what is contributing to a decent amount of patient's discomfort and pain.  Discussed over-the-counter medications and natural supplements that could be more beneficial.  Patient wanted to avoid any type of prescription medications at this time.  We discussed that the ergonomics throughout the day and other changes patient can do as well as keeping well hydrated.  Follow-up again in 1 to 2 weeks to make sure patient symptoms are resolving.

## 2017-09-29 NOTE — Patient Instructions (Signed)
Good to see you  Fish oil 2 grams daily  Tart cherry extract any dose at night CoQ10 200mg  daily for the headaches  Choline 500mg  daily for the memory  Keep watch and write [place] if you have new symptoms.  Lets check in again in 2 weeks to make sure you are better

## 2017-09-29 NOTE — Telephone Encounter (Signed)
Spoke with patient who suffered a head injury on 09/18/17 when she hit her head on a cabinet. She did not lose consciousness, history of migraines. No history of concussion. Has been having headaches daily, eye sensitivity, and fogginess. 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 She also mentions traveling this past weekend and rested quite a bit. On schedule today.

## 2017-09-29 NOTE — Progress Notes (Signed)
Subjective:     Chief Complaint: Angie Martin, DOB: 1976/03/18, is a 42 y.o. female who presents for head injury sustained on 09/18/17. Hit her head on a cabinet. She did not lose consciousness, history of migraines. No history of concussion. Has been having headaches daily, eye sensitivity, and fogginess. 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 She also mentions traveling this past weekend and rested quite a bit.   Chief Complaint  Patient presents with  . Head Injury    Injury date : 09/18/17 Visit #: 1  Previous imagine.   History of Present Illness:    Concussion Self-Reported Symptom Score Symptoms rated on a scale 1-6, in last 24 hours  Headache: 3   Nausea: 0  Vomiting: 0  Balance Difficulty: 2   Dizziness: 2  Fatigue: 3  Trouble Falling Asleep: 0   Sleep More Than Usual: 0  Sleep Less Than Usual: 3  Daytime Drowsiness: 3  Photophobia: 4  Phonophobia: 4  Irritability: 0  Sadness:0  Nervousness: 0  Feeling More Emotional:0  Numbness or Tingling: 4  Feeling Slowed Down: 4  Feeling Mentally Foggy:4  Difficulty Concentrating: 4  Difficulty Remembering: 4  Visual Problems: 0    Total Symptom Score:41   Review of Systems: Pertinent items are noted in HPI.  Review of History: Past Medical History:  Past Medical History:  Diagnosis Date  . 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 ASD (atrial septal defect)    repaired in 2010 at [place] - 65mm Helix ASD Closure  . Depression    no meds - "eat pot cookies"  . Fibroids   . High cholesterol    diet controlled, no meds  . Ocular migraine    otc meds prn  . TIA (transient ischemic attack) 06/2008   mild- surgery for ASD closure    Past Surgical History:  has a past surgical history that includes Tonsillectomy (1993); Helix device (2010); vein laser surgery (2016); ASD repair; bone spur removal (Left, 2001); Wisdom tooth extraction; Robotic assisted  total hysterectomy (N/A, 09/27/2015); and Cystoscopy (09/27/2015). Family History: family history includes ALS in her maternal grandmother; Coronary artery disease in her maternal grandmother and mother; Hypertension in her maternal grandmother and mother. no family history of autoimmune Social History:  reports that she has never smoked. She has never used smokeless tobacco. She reports that she drinks about 6.6 oz of alcohol per week. She reports that she has current or past drug history. Drug: Marijuana. Current Medications: has a current medication list which includes the following prescription(s): alprazolam, ibuprofen, nutritional supplements, and OVER THE COUNTER MEDICATION. Allergies: is allergic to fentanyl.  Objective:    Physical Examination Vitals:   09/29/17 1231  BP: 110/76  Pulse: 65  SpO2: 98%   General: No apparent distress alert and oriented x3 mood and affect normal, dressed appropriately.  Significant stress noted.  Patient is somewhat standoffish HEENT: Pupils equal, extraocular movements intact  Respiratory: Patient's speak in full sentences and does not appear short of breath  Cardiovascular: No lower extremity edema, non tender, no erythema  Skin: Warm dry intact with no signs of infection or rash on extremities or on axial skeleton.  Abdomen: Soft nontender  Neuro: Cranial nerves II through XII are intact, neurovascularly intact in all extremities with 2+ DTRs and 2+ pulses.  Lymph: No lymphadenopathy of posterior or anterior cervical chain or axillae bilaterally.  Gait  normal with good balance and coordination.  MSK:  Non tender with full range of motion and good stability and symmetric strength and tone of shoulders, elbows, wrist,  knee and ankles bilaterally.  Psychiatric: Oriented X3, intact recent and remote memory, judgement and insight, normal mood and affect  Concussion testing performed today:  I spent *45 minutes with patient discussing test and results  including review of history and patient chart and  integration of patient data, interpretation of standardized test results and clinical data, clinical decision making, treatment planning and report,and interactive feedback to the patient with all of patients questions answered.      Vestibular Screening:       Headache  Dizziness  Smooth Pursuits n n  H. Saccades n n  V. Saccades n n  H. VOR n n  V. VOR n n  Visual Motor Sensitivity n n      Convergence: 0 cm  n n     Additional testing performed today: {Balance testing of 28 out of 30   Assessment:     Angie Martin presents with the following concussion subtypes. [x] Cognitive [] Cervical [] Vestibular [] Ocular [] Migraine [] Anxiety/Mood   Plan:   Action/Discussion: Reviewed diagnosis, management options, expected outcomes, and the reasons for scheduled and emergent follow-up. Questions were adequately answered. Patient expressed verbal understanding and agreement with the following plan.       I was personally involved with the physical evaluation of and am in agreement with the assessment and treatment plan for this patient.  Greater than 50% of this encounter was spent in direct consultation with the patient in evaluation, counseling, and coordination of care. Duration of encounter: 61 minutes.  After Visit Summary printed out and provided to patient as appropriate.

## 2017-10-06 DIAGNOSIS — D1801 Hemangioma of skin and subcutaneous tissue: Secondary | ICD-10-CM | POA: Diagnosis not present

## 2017-10-06 DIAGNOSIS — L578 Other skin changes due to chronic exposure to nonionizing radiation: Secondary | ICD-10-CM | POA: Diagnosis not present

## 2017-10-06 DIAGNOSIS — D485 Neoplasm of uncertain behavior of skin: Secondary | ICD-10-CM | POA: Diagnosis not present

## 2017-10-06 DIAGNOSIS — D2239 Melanocytic nevi of other parts of face: Secondary | ICD-10-CM | POA: Diagnosis not present

## 2017-10-06 DIAGNOSIS — D225 Melanocytic nevi of trunk: Secondary | ICD-10-CM | POA: Diagnosis not present

## 2017-10-07 DIAGNOSIS — F4323 Adjustment disorder with mixed anxiety and depressed mood: Secondary | ICD-10-CM | POA: Diagnosis not present

## 2017-10-13 ENCOUNTER — Ambulatory Visit: Payer: BLUE CROSS/BLUE SHIELD | Admitting: Family Medicine

## 2017-10-15 DIAGNOSIS — F321 Major depressive disorder, single episode, moderate: Secondary | ICD-10-CM | POA: Diagnosis not present

## 2017-10-15 DIAGNOSIS — F3341 Major depressive disorder, recurrent, in partial remission: Secondary | ICD-10-CM | POA: Diagnosis not present

## 2017-10-24 DIAGNOSIS — F321 Major depressive disorder, single episode, moderate: Secondary | ICD-10-CM | POA: Diagnosis not present

## 2017-10-24 IMAGING — CT CT HEART SCORING
2 series · 16 of 20 positions shown, 18 images · non-contrast
Comparison: None.

CLINICAL DATA: Risk stratification

EXAM:
Coronary Calcium Score
TECHNIQUE: The patient was scanned on a Siemens Somatom 64 slice scanner. Axial
non-contrast 3 mm slices were carried out through the heart. The
data set was analyzed on a dedicated work station and scored using
the Agatson method.

[Series 2: casc 3.0 i36f 2 bestdiast 79 % · axial · 0.40mm/px · z∈[-406,-295]mm · 8 of 49 slices shown, 10 images]
[im 6/49  vessel]
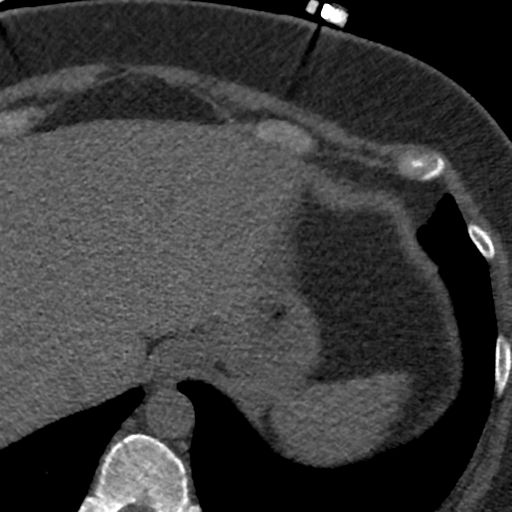
[im 6/49  lung]
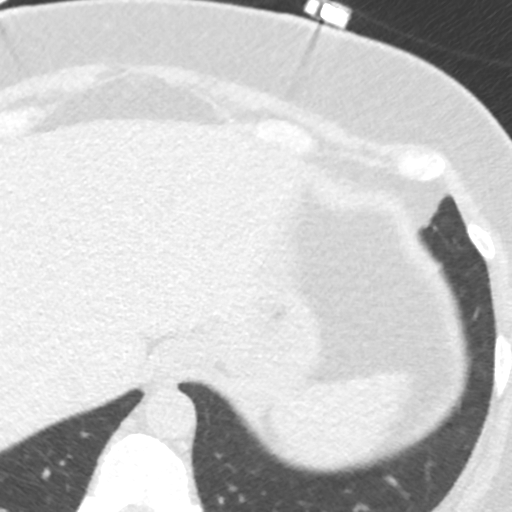
[im 11/49  vessel]
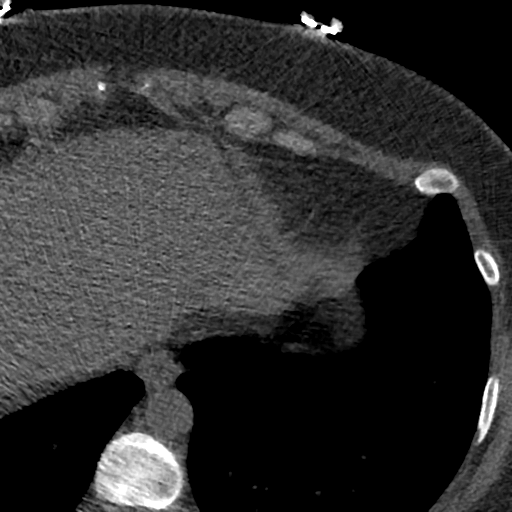
[im 17/49  vessel]
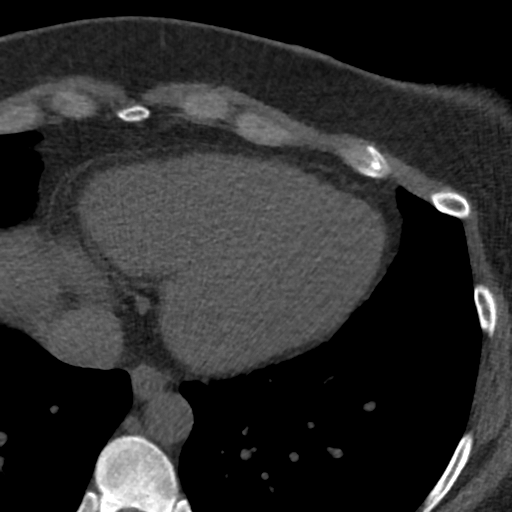
[im 22/49  vessel]
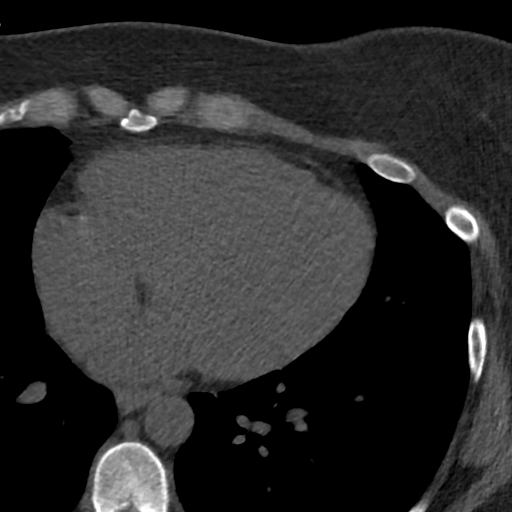
[im 27/49  vessel]
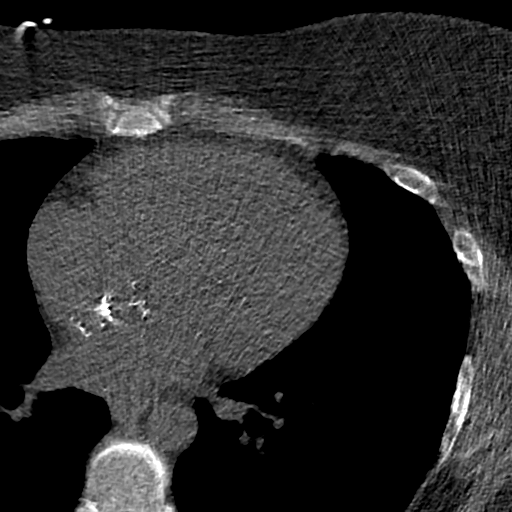
[im 27/49  lung]
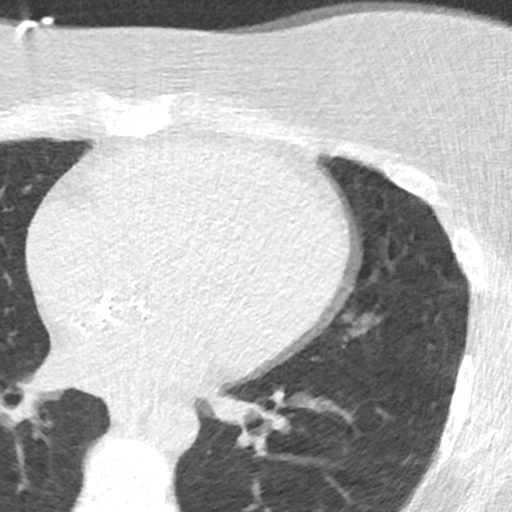
[im 33/49  vessel]
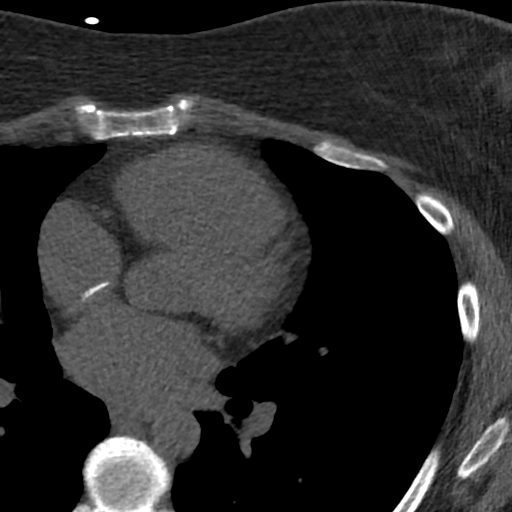
[im 38/49  vessel]
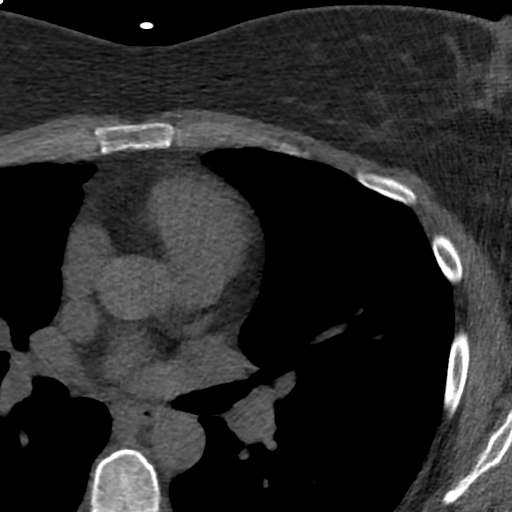
[im 43/49  vessel]
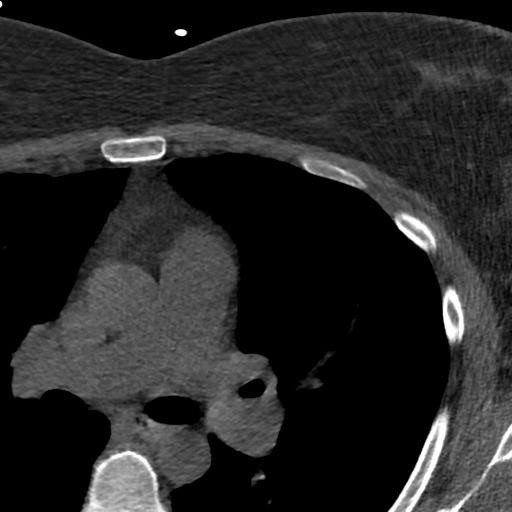

[Series 4: lung st 69 % · axial · 0.68mm/px · z∈[-402,-294]mm · 8 of 48 slices shown]
[im 6/48  lung]
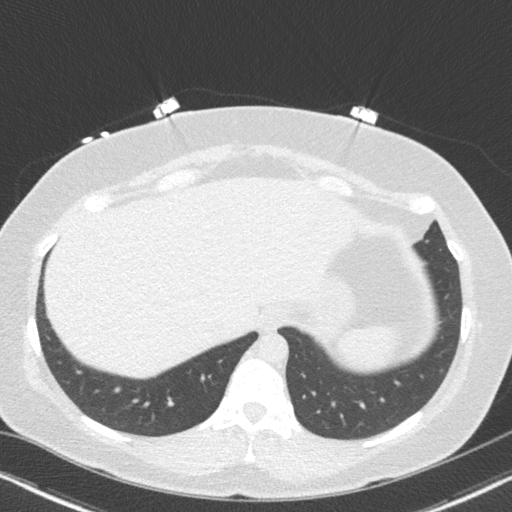
[im 11/48  lung]
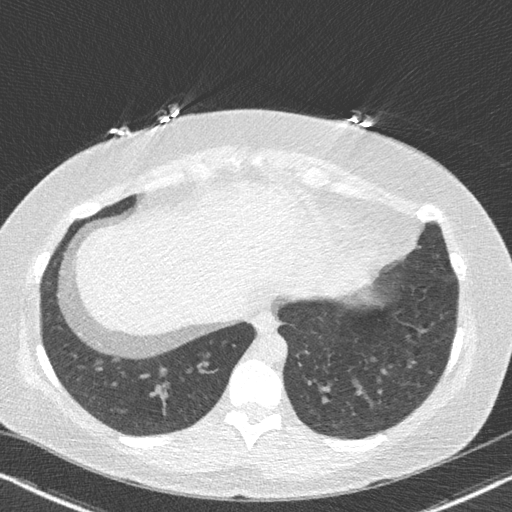
[im 16/48  lung]
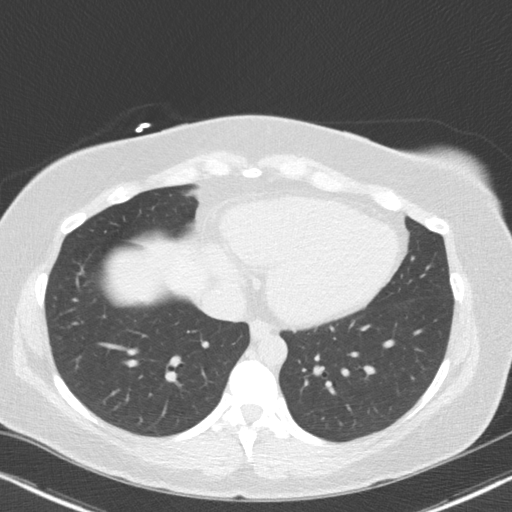
[im 21/48  lung]
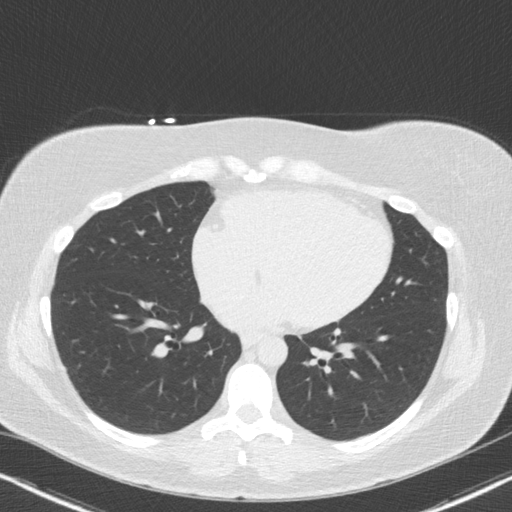
[im 27/48  lung]
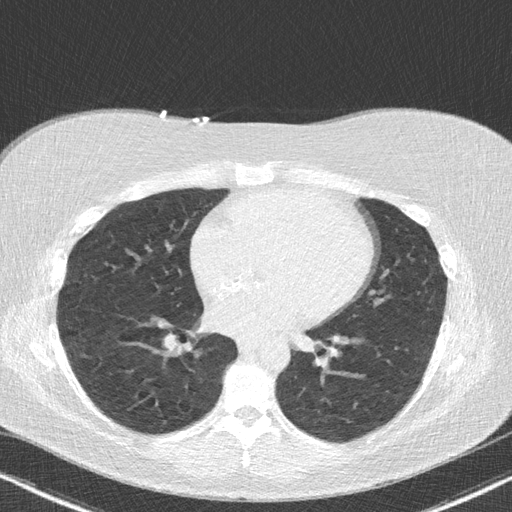
[im 32/48  lung]
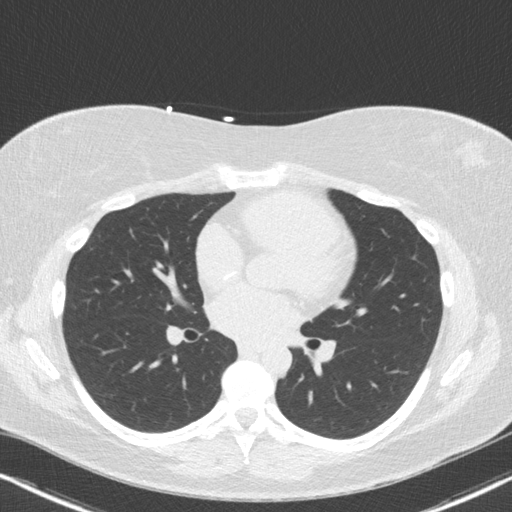
[im 37/48  lung]
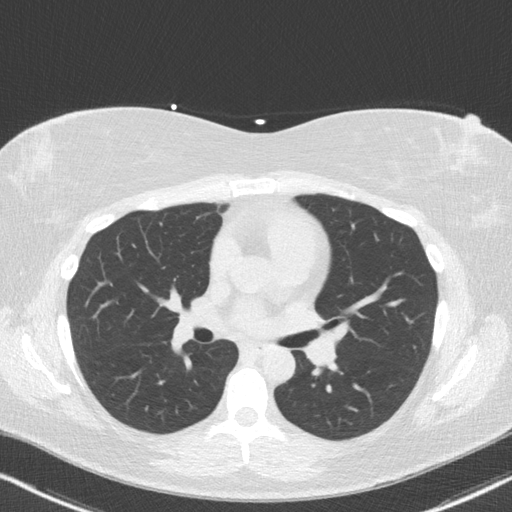
[im 42/48  lung]
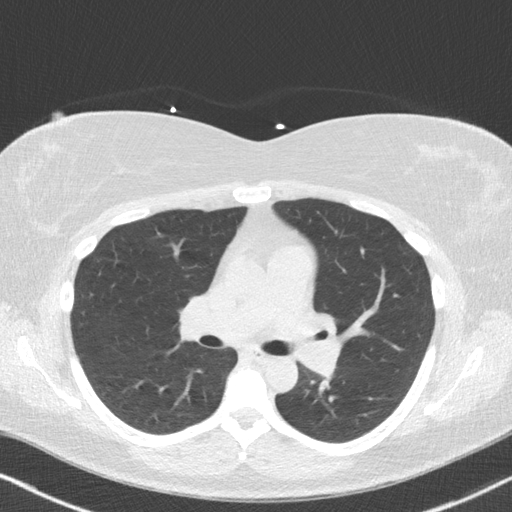

[16 of 20 positions shown; findings below may reference images not displayed]

FINDINGS: Non-cardiac: See separate report from [REDACTED].

Ascending Aorta:  Normal size.  No calcifications.

Pericardium: Normal.

Coronary arteries:  Normal origin.

A septal occluder device is seen in the interatrial septum.
IMPRESSION: Coronary calcium score of 0. This was 0 percentile for age and sex
matched control.

Rapsys Buciene

EXAM:
OVER-READ INTERPRETATION  CT CHEST

The following report is an over-read performed by radiologist Dr.
does not include interpretation of cardiac or coronary anatomy or
pathology. The coronary calcium score interpretation by the
cardiologist is attached.
FINDINGS: The visualized portions of the lungs are clear. The visualized
portions of the mediastinum and chest wall are unremarkable.
IMPRESSION: No significant non-cardiac abnormality seen in visualized portion of
the thorax.

## 2017-11-02 DIAGNOSIS — F321 Major depressive disorder, single episode, moderate: Secondary | ICD-10-CM | POA: Diagnosis not present

## 2017-11-05 DIAGNOSIS — F321 Major depressive disorder, single episode, moderate: Secondary | ICD-10-CM | POA: Diagnosis not present

## 2017-11-12 DIAGNOSIS — F321 Major depressive disorder, single episode, moderate: Secondary | ICD-10-CM | POA: Diagnosis not present

## 2017-11-17 DIAGNOSIS — F3341 Major depressive disorder, recurrent, in partial remission: Secondary | ICD-10-CM | POA: Diagnosis not present

## 2017-11-19 DIAGNOSIS — F321 Major depressive disorder, single episode, moderate: Secondary | ICD-10-CM | POA: Diagnosis not present

## 2017-11-26 DIAGNOSIS — F321 Major depressive disorder, single episode, moderate: Secondary | ICD-10-CM | POA: Diagnosis not present

## 2017-12-11 DIAGNOSIS — F321 Major depressive disorder, single episode, moderate: Secondary | ICD-10-CM | POA: Diagnosis not present

## 2018-01-08 DIAGNOSIS — F321 Major depressive disorder, single episode, moderate: Secondary | ICD-10-CM | POA: Diagnosis not present

## 2018-01-14 DIAGNOSIS — F3341 Major depressive disorder, recurrent, in partial remission: Secondary | ICD-10-CM | POA: Diagnosis not present

## 2018-01-21 DIAGNOSIS — F321 Major depressive disorder, single episode, moderate: Secondary | ICD-10-CM | POA: Diagnosis not present

## 2018-01-29 DIAGNOSIS — F321 Major depressive disorder, single episode, moderate: Secondary | ICD-10-CM | POA: Diagnosis not present

## 2018-02-06 ENCOUNTER — Encounter (HOSPITAL_COMMUNITY): Payer: Self-pay | Admitting: Emergency Medicine

## 2018-02-06 ENCOUNTER — Ambulatory Visit (HOSPITAL_COMMUNITY)
Admission: EM | Admit: 2018-02-06 | Discharge: 2018-02-06 | Disposition: A | Discharge status: Home / Self Care | Payer: BLUE CROSS/BLUE SHIELD

## 2018-02-06 DIAGNOSIS — J069 Acute upper respiratory infection, unspecified: Secondary | ICD-10-CM | POA: Diagnosis not present

## 2018-02-06 NOTE — ED Provider Notes (Signed)
[facility]    CSN: [id number] Arrival date & time: 02/06/18  0803     History   Chief Complaint Chief Complaint  Patient presents with  . URI    HPI Angie Martin is a 42 y.o. female.    URI  Presenting symptoms: congestion, ear pain, facial pain, rhinorrhea and sore throat   Severity:  Moderate Duration:  5 days Timing:  Constant Progression:  Waxing and waning Chronicity:  New Relieved by:  OTC medications Worsened by:  Nothing Associated symptoms: no arthralgias, no headaches, no myalgias, no neck pain, no sinus pain, no sneezing, no swollen glands and no wheezing   Risk factors: no recent illness, no recent travel and no sick contacts     Past Medical History:  Diagnosis Date  . 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 ASD (atrial septal defect)    repaired in 2010 at [place] - 61mm Helix ASD Closure  . Depression    no meds - "eat pot cookies"  . Fibroids   . High cholesterol    diet controlled, no meds  . Ocular migraine    otc meds prn  . TIA (transient ischemic attack) 06/2008   mild- surgery for ASD closure    Patient Active Problem List   Diagnosis Date Noted  . Mild concussion 09/29/2017  . Postoperative state 09/27/2015  . Corneal abrasion, left 09/27/2015  . Chest pain 06/28/2015  . Dyslipidemia 06/28/2015  . History of TIA (transient ischemic attack) 08/17/2014  . Ocular migraine 08/17/2014  . White matter abnormality on MRI of brain 08/17/2014  . History of atrial septal defect repair 08/17/2014    Past Surgical History:  Procedure Laterality Date  . ASD REPAIR    . bone spur removal Left 2001  . CYSTOSCOPY  09/27/2015   Procedure: CYSTOSCOPY;  Surgeon: Bobbye Charleston, MD;  Location: [facility];  Service: Gynecology;;  . Helix device  2010   at [place] - 30 mm Helix ASD Closure  . ROBOTIC ASSISTED TOTAL HYSTERECTOMY N/A 09/27/2015   Procedure: ROBOTIC ASSISTED TOTAL HYSTERECTOMY, Bilateral  Salpingectomy;  Surgeon: Bobbye Charleston, MD;  Location: [facility];  Service: Gynecology;  Laterality: N/A;  . TONSILLECTOMY  1993  . vein laser surgery  2016   bilateral legs  . WISDOM TOOTH EXTRACTION      OB History   None      Home Medications    Prior to Admission medications   Medication Sig Start Date End Date Taking? Authorizing Provider  guaiFENesin (MUCINEX) 600 MG 12 hr tablet Take by mouth 2 (two) times daily.   Yes [provider]  ALPRAZolam Duanne Moron) 1 MG tablet Take 1 mg 3 (three) times daily as needed by mouth for anxiety.     [provider]  ibuprofen (ADVIL,MOTRIN) 800 MG tablet Take 1 tablet (800 mg total) by mouth every 8 (eight) hours as needed (mild pain). 09/28/15   Bobbye Charleston, MD  Nutritional Supplements (JUICE PLUS FIBRE PO) Take 6 capsules daily by mouth.    [provider]  OVER THE COUNTER MEDICATION Take 2 capsules by mouth at bedtime as needed (for sleep). *[place] - sleep supplement*    [provider]    Family History Family History  Problem Relation Age of Onset  . Hypertension Mother   . Coronary artery disease Mother        MI  . Coronary artery disease Maternal Grandmother   .  Hypertension Maternal Grandmother   . ALS Maternal Grandmother     Social History Social History   Tobacco Use  . Smoking status: Never Smoker  . Smokeless tobacco: Never Used  Substance Use Topics  . Alcohol use: Yes    Alcohol/week: 11.0 standard drinks    Types: 9 Shots of liquor, 2 Standard drinks or equivalent per week  . Drug use: Yes    Types: Marijuana    Comment: "eats pot cookies" last used 6/11/7     Allergies   Fentanyl   Review of Systems Review of Systems  HENT: Positive for congestion, ear pain, rhinorrhea and sore throat. Negative for sinus pain and sneezing.   Respiratory: Negative for wheezing.   Musculoskeletal: Negative for arthralgias, myalgias and neck pain.  Neurological: Negative for  headaches.     Physical Exam Triage Vital Signs ED Triage Vitals  Enc Vitals Group     BP 02/06/18 0828 117/77     Pulse Rate 02/06/18 0828 77     Resp 02/06/18 0828 16     Temp 02/06/18 0828 97.7 F (36.5 C)     Temp Source 02/06/18 0828 Oral     SpO2 02/06/18 0828 97 %     Weight --      Height --      Head Circumference --      Peak Flow --      Pain Score 02/06/18 0830 4     Pain Loc --      Pain Edu? --      Excl. in [place]? --    No data found.  Updated Vital Signs BP 117/77 (BP Location: Left Arm)   Pulse 77   Temp 97.7 F (36.5 C) (Oral)   Resp 16   LMP 09/12/2015 (Approximate)   SpO2 97%   Visual Acuity Right Eye Distance:   Left Eye Distance:   Bilateral Distance:    Right Eye Near:   Left Eye Near:    Bilateral Near:     Physical Exam  Constitutional: She appears well-developed and well-nourished.  Very pleasant. Non toxic or ill appearing.   HENT:  Head: Normocephalic and atraumatic.  Right Ear: External ear normal.  Left Ear: External ear normal.  Bilateral TMs normal.  External ears normal.  Without posterior oropharyngeal erythema, tonsillar swelling or exudates. No lesions.  Mild nasal turbinate swelling.  No lymphadenopathy.    Eyes: Conjunctivae are normal.  Neck: Normal range of motion.  Cardiovascular: Normal rate, regular rhythm and normal heart sounds.  Pulmonary/Chest: Effort normal and breath sounds normal.  Lungs clear in all fields. No dyspnea or distress. No retractions or nasal flaring.   Musculoskeletal: Normal range of motion.  Neurological: She is alert.  Skin: Skin is warm and dry.  Psychiatric: She has a normal mood and affect.  Nursing note and vitals reviewed.    UC Treatments / Results  Labs (all labs ordered are listed, but only abnormal results are displayed) Labs Reviewed - No data to display  EKG None  Radiology No results found.  Procedures Procedures (including critical care time)  Medications  Ordered in UC Medications - No data to display  Initial Impression / Assessment and Plan / UC Course  I have reviewed the triage vital signs and the nursing notes.  Pertinent labs & imaging results that were available during my care of the patient were reviewed by me and considered in my medical decision making (see chart for details).  Viral URI Symptomatic treatment Follow up as needed for continued or worsening symptoms Final Clinical Impressions(s) / UC Diagnoses   Final diagnoses:  Viral upper respiratory tract infection     Discharge Instructions     I believe this is a viral upper respiratory infection Keep using the Mucinex DM He can also do Flonase for your symptoms this could help with congestion and pressure feeling in your ears. You could do Benadryl at night this could help with sleeping and cough. If not better by Sunday let us know Follow up as needed for continued or worsening symptoms     ED Prescriptions    None     Controlled Substance Prescriptions [place] Controlled Substance Registry consulted? Not Applicable   Orvan July, NP 02/06/18 [phone]

## 2018-02-06 NOTE — Discharge Instructions (Addendum)
I believe this is a viral upper respiratory infection Keep using the Mucinex DM He can also do Flonase for your symptoms this could help with congestion and pressure feeling in your ears. You could do Benadryl at night this could help with sleeping and cough. If not better by Sunday let us know Follow up as needed for continued or worsening symptoms

## 2018-02-06 NOTE — ED Triage Notes (Signed)
Pt c/o uri symptoms, cough, headache, congestion, fever, facial pain. x5 days

## 2018-02-08 ENCOUNTER — Telehealth (HOSPITAL_COMMUNITY): Payer: Self-pay | Admitting: Family Medicine

## 2018-02-08 MED ORDER — AMOXICILLIN-POT CLAVULANATE 875-125 MG PO TABS: 1.0000 | ORAL_TABLET | Freq: Two times a day (BID) | ORAL | 0 refills | Status: DC

## 2018-02-08 NOTE — Telephone Encounter (Signed)
Patient called in complaining of worsening symptoms with sinus pressure, mucus, fatigue.  I believe that is appropriate to go ahead and call in antibiotics for sinus infection.  Her symptoms have been present for approximately 10 days and not getting better with over-the-counter treatment.

## 2018-02-09 DIAGNOSIS — F321 Major depressive disorder, single episode, moderate: Secondary | ICD-10-CM | POA: Diagnosis not present

## 2018-02-11 DIAGNOSIS — F3341 Major depressive disorder, recurrent, in partial remission: Secondary | ICD-10-CM | POA: Diagnosis not present

## 2018-02-17 DIAGNOSIS — F321 Major depressive disorder, single episode, moderate: Secondary | ICD-10-CM | POA: Diagnosis not present

## 2018-03-11 DIAGNOSIS — F3341 Major depressive disorder, recurrent, in partial remission: Secondary | ICD-10-CM | POA: Diagnosis not present

## 2018-03-13 DIAGNOSIS — F321 Major depressive disorder, single episode, moderate: Secondary | ICD-10-CM | POA: Diagnosis not present

## 2018-03-18 DIAGNOSIS — F321 Major depressive disorder, single episode, moderate: Secondary | ICD-10-CM | POA: Diagnosis not present

## 2018-03-31 DIAGNOSIS — F321 Major depressive disorder, single episode, moderate: Secondary | ICD-10-CM | POA: Diagnosis not present

## 2018-04-15 DIAGNOSIS — F321 Major depressive disorder, single episode, moderate: Secondary | ICD-10-CM | POA: Diagnosis not present

## 2018-04-22 DIAGNOSIS — F321 Major depressive disorder, single episode, moderate: Secondary | ICD-10-CM | POA: Diagnosis not present

## 2018-04-29 DIAGNOSIS — F321 Major depressive disorder, single episode, moderate: Secondary | ICD-10-CM | POA: Diagnosis not present

## 2018-05-13 DIAGNOSIS — F321 Major depressive disorder, single episode, moderate: Secondary | ICD-10-CM | POA: Diagnosis not present

## 2018-05-20 DIAGNOSIS — F321 Major depressive disorder, single episode, moderate: Secondary | ICD-10-CM | POA: Diagnosis not present

## 2018-05-27 DIAGNOSIS — F321 Major depressive disorder, single episode, moderate: Secondary | ICD-10-CM | POA: Diagnosis not present

## 2018-06-03 DIAGNOSIS — F321 Major depressive disorder, single episode, moderate: Secondary | ICD-10-CM | POA: Diagnosis not present

## 2018-06-10 DIAGNOSIS — F321 Major depressive disorder, single episode, moderate: Secondary | ICD-10-CM | POA: Diagnosis not present

## 2018-06-24 DIAGNOSIS — F321 Major depressive disorder, single episode, moderate: Secondary | ICD-10-CM | POA: Diagnosis not present

## 2018-07-08 DIAGNOSIS — F321 Major depressive disorder, single episode, moderate: Secondary | ICD-10-CM | POA: Diagnosis not present

## 2018-07-15 DIAGNOSIS — F321 Major depressive disorder, single episode, moderate: Secondary | ICD-10-CM | POA: Diagnosis not present

## 2018-07-22 DIAGNOSIS — F321 Major depressive disorder, single episode, moderate: Secondary | ICD-10-CM | POA: Diagnosis not present

## 2018-07-29 DIAGNOSIS — F321 Major depressive disorder, single episode, moderate: Secondary | ICD-10-CM | POA: Diagnosis not present

## 2018-08-05 DIAGNOSIS — F321 Major depressive disorder, single episode, moderate: Secondary | ICD-10-CM | POA: Diagnosis not present

## 2018-08-12 DIAGNOSIS — F321 Major depressive disorder, single episode, moderate: Secondary | ICD-10-CM | POA: Diagnosis not present

## 2018-08-19 DIAGNOSIS — F321 Major depressive disorder, single episode, moderate: Secondary | ICD-10-CM | POA: Diagnosis not present

## 2018-09-29 DIAGNOSIS — F3341 Major depressive disorder, recurrent, in partial remission: Secondary | ICD-10-CM | POA: Diagnosis not present

## 2018-10-14 DIAGNOSIS — D2239 Melanocytic nevi of other parts of face: Secondary | ICD-10-CM | POA: Diagnosis not present

## 2018-10-14 DIAGNOSIS — L820 Inflamed seborrheic keratosis: Secondary | ICD-10-CM | POA: Diagnosis not present

## 2018-10-14 DIAGNOSIS — L821 Other seborrheic keratosis: Secondary | ICD-10-CM | POA: Diagnosis not present

## 2018-10-14 DIAGNOSIS — C44212 Basal cell carcinoma of skin of right ear and external auricular canal: Secondary | ICD-10-CM | POA: Diagnosis not present

## 2018-10-14 DIAGNOSIS — D225 Melanocytic nevi of trunk: Secondary | ICD-10-CM | POA: Diagnosis not present

## 2018-10-14 DIAGNOSIS — D1801 Hemangioma of skin and subcutaneous tissue: Secondary | ICD-10-CM | POA: Diagnosis not present

## 2018-10-20 DIAGNOSIS — R350 Frequency of micturition: Secondary | ICD-10-CM | POA: Diagnosis not present

## 2018-10-20 DIAGNOSIS — Z01419 Encounter for gynecological examination (general) (routine) without abnormal findings: Secondary | ICD-10-CM | POA: Diagnosis not present

## 2018-10-20 DIAGNOSIS — Z6831 Body mass index (BMI) 31.0-31.9, adult: Secondary | ICD-10-CM | POA: Diagnosis not present

## 2018-10-20 DIAGNOSIS — Z1231 Encounter for screening mammogram for malignant neoplasm of breast: Secondary | ICD-10-CM | POA: Diagnosis not present

## 2018-10-20 DIAGNOSIS — Z0000 Encounter for general adult medical examination without abnormal findings: Secondary | ICD-10-CM | POA: Diagnosis not present

## 2018-10-28 LAB — HM MAMMOGRAPHY: HM Mammogram: NORMAL (ref 0–4)

## 2018-10-28 LAB — HM PAP SMEAR

## 2018-12-09 DIAGNOSIS — F321 Major depressive disorder, single episode, moderate: Secondary | ICD-10-CM | POA: Diagnosis not present

## 2019-01-13 DIAGNOSIS — F321 Major depressive disorder, single episode, moderate: Secondary | ICD-10-CM | POA: Diagnosis not present

## 2019-01-27 DIAGNOSIS — F321 Major depressive disorder, single episode, moderate: Secondary | ICD-10-CM | POA: Diagnosis not present

## 2019-01-28 ENCOUNTER — Encounter: Payer: Self-pay | Admitting: Family Medicine

## 2019-01-28 ENCOUNTER — Other Ambulatory Visit: Payer: Self-pay

## 2019-01-28 ENCOUNTER — Ambulatory Visit: Payer: BC Managed Care – PPO | Admitting: Family Medicine

## 2019-01-28 VITALS — BP 112/78 | HR 63 | Temp 97.8°F | Resp 16 | Ht 70.0 in | Wt 222.5 lb

## 2019-01-28 DIAGNOSIS — F32A Depression, unspecified: Secondary | ICD-10-CM | POA: Insufficient documentation

## 2019-01-28 DIAGNOSIS — F329 Major depressive disorder, single episode, unspecified: Secondary | ICD-10-CM

## 2019-01-28 DIAGNOSIS — E669 Obesity, unspecified: Secondary | ICD-10-CM

## 2019-01-28 DIAGNOSIS — E785 Hyperlipidemia, unspecified: Secondary | ICD-10-CM | POA: Diagnosis not present

## 2019-01-28 DIAGNOSIS — Z23 Encounter for immunization: Secondary | ICD-10-CM

## 2019-01-28 DIAGNOSIS — F1311 Sedative, hypnotic or anxiolytic abuse, in remission: Secondary | ICD-10-CM

## 2019-01-28 DIAGNOSIS — F419 Anxiety disorder, unspecified: Secondary | ICD-10-CM

## 2019-01-28 DIAGNOSIS — Z8249 Family history of ischemic heart disease and other diseases of the circulatory system: Secondary | ICD-10-CM

## 2019-01-28 NOTE — Patient Instructions (Addendum)
Schedule your complete physical in 6 months We'll notify you of your lab results and make any changes if needed Continue to work on healthy diet and regular exercise- you can do it! START a daily 81mg  Aspirin We'll call you with your stress test No changes to the Lexapro at this time Call with any questions or concerns Welcome!  We're glad to have you! Stay Safe!

## 2019-01-28 NOTE — Assessment & Plan Note (Signed)
New to provider, recurrent issue for pt.  She reports she had lost a considerable amount of weight but he regained 30 lbs since COVID.  Stressed need for healthy diet and regular exercise.  Has support of partner.  Will check labs to risk stratify.

## 2019-01-28 NOTE — Assessment & Plan Note (Signed)
New to provider.  Mom died at age 45 from a presumed MI.  Multiple other family members w/ MI in their 58s and 'no one has made it out of their 66s'.  Stress test 3 yrs ago was low risk but she has gained weight since then.  Will repeat stress test to risk stratify.  In the meantime, start ASA and statin.  Pt expressed understanding and is in agreement w/ plan.

## 2019-01-28 NOTE — Progress Notes (Signed)
   Subjective:    Patient ID: Angie Martin, female    DOB: Jun 16, 1975, 43 y.o.   MRN: [id number]  HPI  New to establish.  Previous MD- Dr Nancy Fetter.  GYN- [facility]  Hyperlipidemia- LDL in July 157, trigs 280.  Was following w/ Dr Philis Pique but labs recently showed elevated cholesterol and needed to find primary care.  + family hx- maternal side.  Has identical twin sister that has no cholesterol issues.  Family hx of CAD- mom died at 55 of suspected heart attack, multiple first cousins had heart attacks in their 65s.  Grandparents died in their 31s.  Had low risk stress test in 2017.  Denies CP, SOB.  Obesity- pt has gained 30 lbs since last visit.  No regular exercise.  Was exercising prior to [place] and regained the weight she had lost.  Hx of xanax addiction- was able to wean off after going through withdrawal sxs.  Was up to 10mg /day.  Was getting prescriptions in multiple names from multiple different doctors.  Anxiety/depression- ongoing issue.  Currently well controlled on Lexapro.  Is seeing a therapist.  Would like to wean but isn't sure if COVID is the right time.   Review of Systems For ROS see HPI     Objective:   Physical Exam Vitals signs reviewed.  Constitutional:      General: She is not in acute distress.    Appearance: She is well-developed. She is obese.  HENT:     Head: Normocephalic and atraumatic.  Eyes:     Conjunctiva/sclera: Conjunctivae normal.     Pupils: Pupils are equal, round, and reactive to light.  Neck:     Musculoskeletal: Normal range of motion and neck supple.     Thyroid: No thyromegaly.  Cardiovascular:     Rate and Rhythm: Normal rate and regular rhythm.     Heart sounds: Normal heart sounds. No murmur.  Pulmonary:     Effort: Pulmonary effort is normal. No respiratory distress.     Breath sounds: Normal breath sounds.  Abdominal:     General: There is no distension.     Palpations: Abdomen is soft.     Tenderness: There is no abdominal  tenderness.  Lymphadenopathy:     Cervical: No cervical adenopathy.  Skin:    General: Skin is warm and dry.  Neurological:     Mental Status: She is alert and oriented to person, place, and time.  Psychiatric:        Behavior: Behavior normal.           Assessment & Plan:

## 2019-01-28 NOTE — Assessment & Plan Note (Signed)
Pt's most recent LDL was 157.  Given her family hx she clearly needs to be on a statin.  Will repeat labs and determine the most appropriate and the correct dose.  Encouraged healthy diet and regular exercise.  Will follow.

## 2019-01-28 NOTE — Assessment & Plan Note (Signed)
New to provider.  Pt reports she had a problem in 2017-18 and was taking up to 10mg  daily.  She was having scripts filled in multiple names and at multiple pharmacies.  She is currently off all benzos.  Applauded her efforts.

## 2019-01-28 NOTE — Progress Notes (Signed)
Administered TDAP 0.5 mL IM left arm per Annye Asa, MD. Patient tolerated well.

## 2019-01-28 NOTE — Assessment & Plan Note (Signed)
New to provider, ongoing for pt.  Feels she is in a good place emotionally and was asking about weaning medication.  Given COVID, the upcoming holidays, and the change in season will hold off at this time.

## 2019-01-29 LAB — CBC WITH DIFFERENTIAL/PLATELET
Basophils Absolute: 0.1 10*3/uL (ref 0.0–0.1)
Basophils Relative: 0.6 % (ref 0.0–3.0)
Eosinophils Absolute: 0.4 10*3/uL (ref 0.0–0.7)
Eosinophils Relative: 4.4 % (ref 0.0–5.0)
HCT: 42 % (ref 36.0–46.0)
Hemoglobin: 14 g/dL (ref 12.0–15.0)
Lymphocytes Relative: 35.8 % (ref 12.0–46.0)
Lymphs Abs: 3.1 10*3/uL (ref 0.7–4.0)
MCHC: 33.4 g/dL (ref 30.0–36.0)
MCV: 94.2 fl (ref 78.0–100.0)
Monocytes Absolute: 0.6 10*3/uL (ref 0.1–1.0)
Monocytes Relative: 7.3 % (ref 3.0–12.0)
Neutro Abs: 4.5 10*3/uL (ref 1.4–7.7)
Neutrophils Relative %: 51.9 % (ref 43.0–77.0)
Platelets: 181 10*3/uL (ref 150.0–400.0)
RBC: 4.46 Mil/uL (ref 3.87–5.11)
RDW: 13 % (ref 11.5–15.5)
WBC: 8.7 10*3/uL (ref 4.0–10.5)

## 2019-01-29 LAB — BASIC METABOLIC PANEL
BUN: 22 mg/dL (ref 6–23)
CO2: 23 mEq/L (ref 19–32)
Calcium: 9.4 mg/dL (ref 8.4–10.5)
Chloride: 105 mEq/L (ref 96–112)
Creatinine, Ser: 0.69 mg/dL (ref 0.40–1.20)
GFR: 91.87 mL/min (ref >60.00)
Glucose, Bld: 91 mg/dL (ref 70–99)
Potassium: 4.6 mEq/L (ref 3.5–5.1)
Sodium: 138 mEq/L (ref 135–145)

## 2019-01-29 LAB — HEPATIC FUNCTION PANEL
ALT: 20 U/L (ref 0–35)
AST: 19 U/L (ref 0–37)
Albumin: 4.6 g/dL (ref 3.5–5.2)
Alkaline Phosphatase: 50 U/L (ref 39–117)
Bilirubin, Direct: 0.1 mg/dL (ref 0.0–0.3)
Total Bilirubin: 0.5 mg/dL (ref 0.2–1.2)
Total Protein: 7 g/dL (ref 6.0–8.3)

## 2019-01-29 LAB — LIPID PANEL
Cholesterol: 266 mg/dL — ABNORMAL HIGH (ref 0–200)
HDL: 47.6 mg/dL (ref >39.00)
NonHDL: 218.55
Total CHOL/HDL Ratio: 6
Triglycerides: 208 mg/dL — ABNORMAL HIGH (ref 0.0–149.0)
VLDL: 41.6 mg/dL — ABNORMAL HIGH (ref 0.0–40.0)

## 2019-01-29 LAB — LDL CHOLESTEROL, DIRECT: Direct LDL: 183 mg/dL

## 2019-01-29 LAB — TSH: TSH: 3.09 u[IU]/mL (ref 0.35–4.50)

## 2019-02-01 NOTE — Progress Notes (Signed)
Called pt and lmovm to return call.

## 2019-02-04 ENCOUNTER — Encounter: Payer: Self-pay | Admitting: Family Medicine

## 2019-02-04 ENCOUNTER — Other Ambulatory Visit: Payer: Self-pay | Admitting: General Practice

## 2019-02-04 MED ORDER — ROSUVASTATIN CALCIUM 20 MG PO TABS: 20.0000 mg | ORAL_TABLET | Freq: Every day | ORAL | 1 refills | Status: DC

## 2019-02-10 DIAGNOSIS — F321 Major depressive disorder, single episode, moderate: Secondary | ICD-10-CM | POA: Diagnosis not present

## 2019-02-16 ENCOUNTER — Other Ambulatory Visit (HOSPITAL_COMMUNITY): Payer: BC Managed Care – PPO

## 2019-02-16 ENCOUNTER — Telehealth (HOSPITAL_COMMUNITY): Payer: Self-pay

## 2019-02-16 ENCOUNTER — Other Ambulatory Visit (HOSPITAL_COMMUNITY)
Admission: RE | Admit: 2019-02-16 | Discharge: 2019-02-16 | Disposition: A | Discharge status: Home / Self Care | Payer: BC Managed Care – PPO | Source: Ambulatory Visit | Attending: Family Medicine | Admitting: Family Medicine

## 2019-02-16 DIAGNOSIS — Z20828 Contact with and (suspected) exposure to other viral communicable diseases: Secondary | ICD-10-CM | POA: Diagnosis not present

## 2019-02-16 DIAGNOSIS — Z01812 Encounter for preprocedural laboratory examination: Secondary | ICD-10-CM | POA: Diagnosis not present

## 2019-02-16 NOTE — Telephone Encounter (Signed)
Encounter complete. 

## 2019-02-17 LAB — NOVEL CORONAVIRUS, NAA (HOSP ORDER, SEND-OUT TO REF LAB; TAT 18-24 HRS): SARS-CoV-2, NAA: NOT DETECTED

## 2019-02-19 ENCOUNTER — Other Ambulatory Visit: Payer: Self-pay

## 2019-02-19 ENCOUNTER — Ambulatory Visit (HOSPITAL_COMMUNITY)
Admission: RE | Admit: 2019-02-19 | Discharge: 2019-02-19 | Disposition: A | Discharge status: Home / Self Care | Payer: BC Managed Care – PPO | Source: Ambulatory Visit | Attending: Cardiology | Admitting: Cardiology

## 2019-02-19 DIAGNOSIS — Z8249 Family history of ischemic heart disease and other diseases of the circulatory system: Secondary | ICD-10-CM | POA: Insufficient documentation

## 2019-02-19 LAB — EXERCISE TOLERANCE TEST
Estimated workload: 11.1 METS
Exercise duration (min): 9 min
Exercise duration (sec): 36 s
MPHR: 175 {beats}/min
Peak HR: 162 {beats}/min
Percent HR: 92 %
Rest HR: 75 {beats}/min

## 2019-02-24 DIAGNOSIS — F321 Major depressive disorder, single episode, moderate: Secondary | ICD-10-CM | POA: Diagnosis not present

## 2019-03-02 ENCOUNTER — Other Ambulatory Visit: Payer: Self-pay | Admitting: Family Medicine

## 2019-03-10 DIAGNOSIS — F321 Major depressive disorder, single episode, moderate: Secondary | ICD-10-CM | POA: Diagnosis not present

## 2019-03-14 ENCOUNTER — Encounter: Payer: Self-pay | Admitting: Family Medicine

## 2019-03-18 ENCOUNTER — Encounter: Payer: Self-pay | Admitting: General Practice

## 2019-03-18 ENCOUNTER — Other Ambulatory Visit: Payer: Self-pay

## 2019-03-18 ENCOUNTER — Ambulatory Visit (INDEPENDENT_AMBULATORY_CARE_PROVIDER_SITE_OTHER): Payer: BC Managed Care – PPO

## 2019-03-18 DIAGNOSIS — E7800 Pure hypercholesterolemia, unspecified: Secondary | ICD-10-CM | POA: Diagnosis not present

## 2019-03-18 LAB — HEPATIC FUNCTION PANEL
ALT: 21 U/L (ref 0–35)
AST: 17 U/L (ref 0–37)
Albumin: 4.4 g/dL (ref 3.5–5.2)
Alkaline Phosphatase: 56 U/L (ref 39–117)
Bilirubin, Direct: 0.2 mg/dL (ref 0.0–0.3)
Total Bilirubin: 1.1 mg/dL (ref 0.2–1.2)
Total Protein: 6.6 g/dL (ref 6.0–8.3)

## 2019-03-24 DIAGNOSIS — F321 Major depressive disorder, single episode, moderate: Secondary | ICD-10-CM | POA: Diagnosis not present

## 2019-04-07 DIAGNOSIS — F321 Major depressive disorder, single episode, moderate: Secondary | ICD-10-CM | POA: Diagnosis not present

## 2019-04-21 DIAGNOSIS — F321 Major depressive disorder, single episode, moderate: Secondary | ICD-10-CM | POA: Diagnosis not present

## 2019-05-05 DIAGNOSIS — F321 Major depressive disorder, single episode, moderate: Secondary | ICD-10-CM | POA: Diagnosis not present

## 2019-05-18 DIAGNOSIS — F321 Major depressive disorder, single episode, moderate: Secondary | ICD-10-CM | POA: Diagnosis not present

## 2019-06-02 DIAGNOSIS — F321 Major depressive disorder, single episode, moderate: Secondary | ICD-10-CM | POA: Diagnosis not present

## 2019-06-12 ENCOUNTER — Ambulatory Visit: Payer: BC Managed Care – PPO | Attending: Internal Medicine

## 2019-06-12 DIAGNOSIS — Z23 Encounter for immunization: Secondary | ICD-10-CM

## 2019-06-12 NOTE — Progress Notes (Signed)
   Covid-19 Vaccination Clinic  Name:  Zowie Hoffmeyer    MRN: [id number] DOB: 1975-09-13  06/12/2019  Ms. Panas was observed post Covid-19 immunization for 15 minutes without incident. She was provided with Vaccine Information Sheet and instruction to access the V-Safe system.   Ms. Gongora was instructed to call 911 with any severe reactions post vaccine: Marland Kitchen Difficulty breathing  . Swelling of face and throat  . A fast heartbeat  . A bad rash all over body  . Dizziness and weakness   Immunizations Administered    Name Date Dose VIS Date Route   Pfizer COVID-19 Vaccine 06/12/2019 10:35 AM 0.3 mL 03/12/2019 Intramuscular   Manufacturer: [place]   Lot: [id number]   [place]: [id number]

## 2019-06-16 DIAGNOSIS — F321 Major depressive disorder, single episode, moderate: Secondary | ICD-10-CM | POA: Diagnosis not present

## 2019-06-30 DIAGNOSIS — F321 Major depressive disorder, single episode, moderate: Secondary | ICD-10-CM | POA: Diagnosis not present

## 2019-07-05 ENCOUNTER — Ambulatory Visit: Payer: BC Managed Care – PPO

## 2019-07-05 ENCOUNTER — Ambulatory Visit: Payer: BC Managed Care – PPO | Attending: Internal Medicine

## 2019-07-05 DIAGNOSIS — Z23 Encounter for immunization: Secondary | ICD-10-CM

## 2019-07-05 NOTE — Progress Notes (Signed)
   Covid-19 Vaccination Clinic  Name:  Angie Martin    MRN: [id number] DOB: 04-05-1975  07/05/2019  [name] was observed post Covid-19 immunization for 15 minutes without incident. She was provided with Vaccine Information Sheet and instruction to access the V-Safe system.   [name] was instructed to call 911 with any severe reactions post vaccine: Marland Kitchen Difficulty breathing  . Swelling of face and throat  . A fast heartbeat  . A bad rash all over body  . Dizziness and weakness   Immunizations Administered    Name Date Dose VIS Date Route   Pfizer COVID-19 Vaccine 07/05/2019  9:28 AM 0.3 mL 03/12/2019 Intramuscular   Manufacturer: [place]   Lot: [id number]   [place]: [id number]

## 2019-07-27 ENCOUNTER — Encounter: Payer: BC Managed Care – PPO | Admitting: Family Medicine

## 2019-07-30 ENCOUNTER — Encounter: Payer: BC Managed Care – PPO | Admitting: Family Medicine

## 2019-08-04 ENCOUNTER — Ambulatory Visit (INDEPENDENT_AMBULATORY_CARE_PROVIDER_SITE_OTHER): Payer: BC Managed Care – PPO | Admitting: Family Medicine

## 2019-08-04 ENCOUNTER — Other Ambulatory Visit: Payer: Self-pay

## 2019-08-04 ENCOUNTER — Encounter: Payer: Self-pay | Admitting: Family Medicine

## 2019-08-04 VITALS — BP 118/80 | HR 78 | Temp 98.0°F | Resp 16 | Ht 70.0 in | Wt 229.1 lb

## 2019-08-04 DIAGNOSIS — E669 Obesity, unspecified: Secondary | ICD-10-CM

## 2019-08-04 DIAGNOSIS — Z0000 Encounter for general adult medical examination without abnormal findings: Secondary | ICD-10-CM | POA: Insufficient documentation

## 2019-08-04 LAB — LIPID PANEL
Cholesterol: 144 mg/dL (ref 0–200)
HDL: 57.1 mg/dL (ref >39.00)
LDL Cholesterol: 67 mg/dL (ref 0–99)
NonHDL: 86.88
Total CHOL/HDL Ratio: 3
Triglycerides: 99 mg/dL (ref 0.0–149.0)
VLDL: 19.8 mg/dL (ref 0.0–40.0)

## 2019-08-04 LAB — BASIC METABOLIC PANEL
BUN: 11 mg/dL (ref 6–23)
CO2: 29 mEq/L (ref 19–32)
Calcium: 9.6 mg/dL (ref 8.4–10.5)
Chloride: 102 mEq/L (ref 96–112)
Creatinine, Ser: 0.62 mg/dL (ref 0.40–1.20)
GFR: 103.71 mL/min (ref >60.00)
Glucose, Bld: 101 mg/dL — ABNORMAL HIGH (ref 70–99)
Potassium: 4.7 mEq/L (ref 3.5–5.1)
Sodium: 137 mEq/L (ref 135–145)

## 2019-08-04 LAB — HEPATIC FUNCTION PANEL
ALT: 23 U/L (ref 0–35)
AST: 25 U/L (ref 0–37)
Albumin: 4.7 g/dL (ref 3.5–5.2)
Alkaline Phosphatase: 64 U/L (ref 39–117)
Bilirubin, Direct: 0.2 mg/dL (ref 0.0–0.3)
Total Bilirubin: 1.2 mg/dL (ref 0.2–1.2)
Total Protein: 7 g/dL (ref 6.0–8.3)

## 2019-08-04 LAB — TSH: TSH: 2.29 u[IU]/mL (ref 0.35–4.50)

## 2019-08-04 NOTE — Progress Notes (Signed)
   Subjective:    Patient ID: Angie Martin, female    DOB: 1975-07-23, 44 y.o.   MRN: [id number]  HPI CPE- UTD on pap, mammo, immunizations.  'I feel great'   Review of Systems Patient reports no vision/ hearing changes, adenopathy,fever, weight change,  persistant/recurrent hoarseness , swallowing issues, chest pain, palpitations, edema, persistant/recurrent cough, hemoptysis, dyspnea (rest/exertional/paroxysmal nocturnal), gastrointestinal bleeding (melena, rectal bleeding), abdominal pain, significant heartburn, bowel changes, GU symptoms (dysuria, hematuria, incontinence), Gyn symptoms (abnormal  bleeding, pain),  syncope, focal weakness, memory loss, numbness & tingling, skin/hair/nail changes, abnormal bruising or bleeding, anxiety, or depression.   This visit occurred during the SARS-CoV-2 public health emergency.  Safety protocols were in place, including screening questions prior to the visit, additional usage of staff PPE, and extensive cleaning of exam room while observing appropriate contact time as indicated for disinfecting solutions.       Objective:   Physical Exam General Appearance:    Alert, cooperative, no distress, appears stated age, obese  Head:    Normocephalic, without obvious abnormality, atraumatic  Eyes:    PERRL, conjunctiva/corneas clear, EOM's intact, fundi    benign, both eyes  Ears:    Normal TM's and external ear canals, both ears  Nose:   Deferred due to COVID  Throat:   Neck:   Supple, symmetrical, trachea midline, no adenopathy;    Thyroid: no enlargement/tenderness/nodules  Back:     Symmetric, no curvature, ROM normal, no CVA tenderness  Lungs:     Clear to auscultation bilaterally, respirations unlabored  Chest Wall:    No tenderness or deformity   Heart:    Regular rate and rhythm, S1 and S2 normal, no murmur, rub   or gallop  Breast Exam:    Deferred to GYN  Abdomen:     Soft, non-tender, bowel sounds active all four quadrants,    no masses, no  organomegaly  Genitalia:    Deferred to GYN  Rectal:    Extremities:   Extremities normal, atraumatic, no cyanosis or edema  Pulses:   2+ and symmetric all extremities  Skin:   Skin color, texture, turgor normal, no rashes or lesions  Lymph nodes:   Cervical, supraclavicular, and axillary nodes normal  Neurologic:   CNII-XII intact, normal strength, sensation and reflexes    throughout          Assessment & Plan:

## 2019-08-04 NOTE — Assessment & Plan Note (Signed)
Deteriorated.  Pt has gained 6 lbs since last visit.  Encouraged healthy diet and regular exercise.  Check labs to risk stratify.  Will follow.

## 2019-08-04 NOTE — Patient Instructions (Signed)
Follow up in 6 months to recheck cholesterol We'll notify you of your lab results and make any changes if needed Continue to work on healthy diet and regular exercise- you can do it! Call with any questions or concerns GOOD LUCK ON THE REOPENING!

## 2019-08-04 NOTE — Assessment & Plan Note (Signed)
Pt's PE WNL w/ exception of obesity.  UTD on pap, mammo, immunizations.  Check labs.  Anticipatory guidance provided.  

## 2019-08-05 ENCOUNTER — Telehealth: Payer: Self-pay | Admitting: Emergency Medicine

## 2019-08-05 DIAGNOSIS — Z0000 Encounter for general adult medical examination without abnormal findings: Secondary | ICD-10-CM

## 2019-08-05 NOTE — Telephone Encounter (Signed)
Spoke with patient of needing to redraw the cbc due to clotting. Patient states has happened before. She will have to call back to schedule an lab appointment. Will place order for cbc for redraw.    Santiago Glad from [place] lab call the office advising patient cbc clotted. Will have to redraw her lavender tube and add a light blue with it in case of clotting.

## 2019-08-09 ENCOUNTER — Encounter: Payer: Self-pay | Admitting: General Practice

## 2019-08-09 ENCOUNTER — Other Ambulatory Visit (INDEPENDENT_AMBULATORY_CARE_PROVIDER_SITE_OTHER): Payer: BC Managed Care – PPO

## 2019-08-09 DIAGNOSIS — Z0000 Encounter for general adult medical examination without abnormal findings: Secondary | ICD-10-CM | POA: Diagnosis not present

## 2019-08-09 LAB — CBC WITH DIFFERENTIAL/PLATELET
Basophils Absolute: 0 10*3/uL (ref 0.0–0.1)
Basophils Relative: 0.6 % (ref 0.0–3.0)
Eosinophils Absolute: 0.1 10*3/uL (ref 0.0–0.7)
Eosinophils Relative: 1.3 % (ref 0.0–5.0)
HCT: 41.8 % (ref 36.0–46.0)
Hemoglobin: 13.9 g/dL (ref 12.0–15.0)
Lymphocytes Relative: 29.3 % (ref 12.0–46.0)
Lymphs Abs: 2.4 10*3/uL (ref 0.7–4.0)
MCHC: 33.3 g/dL (ref 30.0–36.0)
MCV: 90.6 fl (ref 78.0–100.0)
Monocytes Absolute: 0.5 10*3/uL (ref 0.1–1.0)
Monocytes Relative: 6.5 % (ref 3.0–12.0)
Neutro Abs: 5.2 10*3/uL (ref 1.4–7.7)
Neutrophils Relative %: 62.3 % (ref 43.0–77.0)
Platelets: 115 10*3/uL — ABNORMAL LOW (ref 150.0–400.0)
RBC: 4.62 Mil/uL (ref 3.87–5.11)
RDW: 13.8 % (ref 11.5–15.5)
WBC: 8.3 10*3/uL (ref 4.0–10.5)

## 2019-08-17 DIAGNOSIS — F321 Major depressive disorder, single episode, moderate: Secondary | ICD-10-CM | POA: Diagnosis not present

## 2019-08-28 ENCOUNTER — Other Ambulatory Visit: Payer: Self-pay | Admitting: Family Medicine

## 2019-09-05 ENCOUNTER — Encounter: Payer: Self-pay | Admitting: Family Medicine

## 2019-09-14 DIAGNOSIS — F321 Major depressive disorder, single episode, moderate: Secondary | ICD-10-CM | POA: Diagnosis not present

## 2019-09-28 DIAGNOSIS — H1045 Other chronic allergic conjunctivitis: Secondary | ICD-10-CM | POA: Diagnosis not present

## 2019-09-28 DIAGNOSIS — H04123 Dry eye syndrome of bilateral lacrimal glands: Secondary | ICD-10-CM | POA: Diagnosis not present

## 2019-09-28 DIAGNOSIS — D1801 Hemangioma of skin and subcutaneous tissue: Secondary | ICD-10-CM | POA: Diagnosis not present

## 2019-09-30 DIAGNOSIS — D1809 Hemangioma of other sites: Secondary | ICD-10-CM | POA: Diagnosis not present

## 2019-09-30 DIAGNOSIS — D1801 Hemangioma of skin and subcutaneous tissue: Secondary | ICD-10-CM | POA: Diagnosis not present

## 2019-10-14 DIAGNOSIS — D2239 Melanocytic nevi of other parts of face: Secondary | ICD-10-CM | POA: Diagnosis not present

## 2019-10-14 DIAGNOSIS — L578 Other skin changes due to chronic exposure to nonionizing radiation: Secondary | ICD-10-CM | POA: Diagnosis not present

## 2019-10-14 DIAGNOSIS — D1801 Hemangioma of skin and subcutaneous tissue: Secondary | ICD-10-CM | POA: Diagnosis not present

## 2019-10-14 DIAGNOSIS — D225 Melanocytic nevi of trunk: Secondary | ICD-10-CM | POA: Diagnosis not present

## 2019-11-02 DIAGNOSIS — Z124 Encounter for screening for malignant neoplasm of cervix: Secondary | ICD-10-CM | POA: Diagnosis not present

## 2019-11-02 DIAGNOSIS — Z8041 Family history of malignant neoplasm of ovary: Secondary | ICD-10-CM | POA: Diagnosis not present

## 2019-11-02 DIAGNOSIS — Z01419 Encounter for gynecological examination (general) (routine) without abnormal findings: Secondary | ICD-10-CM | POA: Diagnosis not present

## 2019-11-02 DIAGNOSIS — Z6834 Body mass index (BMI) 34.0-34.9, adult: Secondary | ICD-10-CM | POA: Diagnosis not present

## 2019-11-02 LAB — HM MAMMOGRAPHY: HM Mammogram: NORMAL (ref 0–4)

## 2019-11-09 ENCOUNTER — Encounter: Payer: Self-pay | Admitting: General Practice

## 2019-11-19 ENCOUNTER — Other Ambulatory Visit: Payer: Self-pay

## 2019-11-19 ENCOUNTER — Encounter: Payer: Self-pay | Admitting: Physician Assistant

## 2019-11-19 ENCOUNTER — Ambulatory Visit: Payer: BC Managed Care – PPO | Admitting: Physician Assistant

## 2019-11-19 VITALS — BP 128/82 | HR 74 | Wt 236.2 lb

## 2019-11-19 DIAGNOSIS — F411 Generalized anxiety disorder: Secondary | ICD-10-CM | POA: Diagnosis not present

## 2019-11-19 DIAGNOSIS — R61 Generalized hyperhidrosis: Secondary | ICD-10-CM | POA: Diagnosis not present

## 2019-11-19 DIAGNOSIS — M791 Myalgia, unspecified site: Secondary | ICD-10-CM | POA: Diagnosis not present

## 2019-11-19 LAB — COMPREHENSIVE METABOLIC PANEL
ALT: 19 U/L (ref 0–35)
AST: 20 U/L (ref 0–37)
Albumin: 4.6 g/dL (ref 3.5–5.2)
Alkaline Phosphatase: 55 U/L (ref 39–117)
BUN: 14 mg/dL (ref 6–23)
CO2: 24 mEq/L (ref 19–32)
Calcium: 9.4 mg/dL (ref 8.4–10.5)
Chloride: 104 mEq/L (ref 96–112)
Creatinine, Ser: 0.64 mg/dL (ref 0.40–1.20)
GFR: 99.85 mL/min (ref >60.00)
Glucose, Bld: 103 mg/dL — ABNORMAL HIGH (ref 70–99)
Potassium: 4.5 mEq/L (ref 3.5–5.1)
Sodium: 136 mEq/L (ref 135–145)
Total Bilirubin: 0.9 mg/dL (ref 0.2–1.2)
Total Protein: 7 g/dL (ref 6.0–8.3)

## 2019-11-19 LAB — CBC WITH DIFFERENTIAL/PLATELET
Basophils Absolute: 0.2 10*3/uL — ABNORMAL HIGH (ref 0.0–0.1)
Basophils Relative: 2.2 % (ref 0.0–3.0)
Eosinophils Absolute: 0.1 10*3/uL (ref 0.0–0.7)
Eosinophils Relative: 1.3 % (ref 0.0–5.0)
HCT: 42.2 % (ref 36.0–46.0)
Hemoglobin: 13.9 g/dL (ref 12.0–15.0)
Lymphocytes Relative: 27.7 % (ref 12.0–46.0)
Lymphs Abs: 2 10*3/uL (ref 0.7–4.0)
MCHC: 32.9 g/dL (ref 30.0–36.0)
MCV: 92.2 fl (ref 78.0–100.0)
Monocytes Absolute: 0.5 10*3/uL (ref 0.1–1.0)
Monocytes Relative: 7.1 % (ref 3.0–12.0)
Neutro Abs: 4.4 10*3/uL (ref 1.4–7.7)
Neutrophils Relative %: 61.7 % (ref 43.0–77.0)
Platelets: 156 10*3/uL (ref 150.0–400.0)
RBC: 4.58 Mil/uL (ref 3.87–5.11)
RDW: 13.2 % (ref 11.5–15.5)
WBC: 7.1 10*3/uL (ref 4.0–10.5)

## 2019-11-19 LAB — T4, FREE: Free T4: 0.75 ng/dL (ref 0.60–1.60)

## 2019-11-19 LAB — TSH: TSH: 2.17 u[IU]/mL (ref 0.35–4.50)

## 2019-11-19 MED ORDER — HYDROXYZINE HCL 10 MG PO TABS: 10.0000 mg | ORAL_TABLET | Freq: Three times a day (TID) | ORAL | 0 refills | Status: DC | PRN

## 2019-11-19 NOTE — Progress Notes (Signed)
Patient presents to clinic today to discuss multiple ongoing issues. Was last seen in May by her PCP for CPE.   Patient endorses over the past couple of weeks noting some muscle tension and pain around her R eye. Denies full headache, eye pain or vision changes. Sometimes tension will go to temporal region of her head and is associated with neck and shoulder tension. Patient notes significant increased stressors over the past 6-8 months -- financial and personal. Notes high stress levels with some anxiety. Denies depressed mood or anhedonia. Has noted increase in sweating over the past 8 months, mainly from the axillary region up. Denies night sweats. Denies unexplainable fevers, weight change, etc. Patient is s/p hysterectomy. Notes mother began menopause at 79.    Past Medical History:  Diagnosis Date  . 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 ASD (atrial septal defect)    repaired in 2010 at [facility] - 15mm Helix ASD Closure  . Depression    no meds - "eat pot cookies"  . Fibroids   . High cholesterol    diet controlled, no meds  . Ocular migraine    otc meds prn  . TIA (transient ischemic attack) 06/2008   mild- surgery for ASD closure    Current Outpatient Medications on File Prior to Visit  Medication Sig Dispense Refill  . escitalopram (LEXAPRO) 10 MG tablet Take 10 mg by mouth daily.    . rosuvastatin (CRESTOR) 20 MG tablet TAKE 1 TABLET BY MOUTH EVERY DAY 90 tablet 1   No current facility-administered medications on file prior to visit.    Allergies  Allergen Reactions  . Fentanyl Itching    Family History  Problem Relation Age of Onset  . Hypertension Mother   . Coronary artery disease Mother        MI  . Coronary artery disease Maternal Grandmother   . Hypertension Maternal Grandmother   . ALS Maternal Grandmother     Social History   Socioeconomic History  . Marital status: Soil scientist    Spouse name: Not on file  .  Number of children: 0  . Years of education: college  . Highest education level: Not on file  Occupational History  . Occupation: Insurance account manager: OTHER    Comment: [employer]  Tobacco Use  . Smoking status: Never Smoker  . Smokeless tobacco: Never Used  Substance and Sexual Activity  . Alcohol use: Yes    Alcohol/week: 11.0 standard drinks    Types: 9 Shots of liquor, 2 Standard drinks or equivalent per week  . Drug use: Yes    Types: Marijuana    Comment: "eats pot cookies" last used 6/11/7  . Sexual activity: Yes    Partners: Female    Birth control/protection: None    Comment: Partner- Psychiatrist  Other Topics Concern  . Not on file  Social History Narrative   Partner- Jenetta Loges.  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  No children.      epworth sleepiness scale = 4 (06/28/2015)   Social Determinants of Health   Financial Resource Strain:   . Difficulty of Paying Living Expenses: Not on file  Food Insecurity:   . Worried About Charity fundraiser in the Last Year: Not on file  . Ran Out of Food in the Last Year: Not on file  Transportation Needs:   . Lack of Transportation (Medical): Not on file  .  Lack of Transportation (Non-Medical): Not on file  Physical Activity:   . Days of Exercise per Week: Not on file  . Minutes of Exercise per Session: Not on file  Stress:   . Feeling of Stress : Not on file  Social Connections:   . Frequency of Communication with Friends and Family: Not on file  . Frequency of Social Gatherings with Friends and Family: Not on file  . Attends Religious Services: Not on file  . Active Member of Clubs or Organizations: Not on file  . Attends Archivist Meetings: Not on file  . Marital Status: Not on file   Review of Systems - See HPI.  All other ROS are negative.  LMP 09/12/2015 (Approximate)   Physical Exam Constitutional:      Appearance: Normal appearance.  HENT:     Head: Normocephalic and atraumatic. No  right periorbital erythema.     Jaw: There is normal jaw occlusion.   Eyes:     Conjunctiva/sclera: Conjunctivae normal.     Pupils: Pupils are equal, round, and reactive to light.  Neck:     Thyroid: No thyroid mass.  Cardiovascular:     Rate and Rhythm: Normal rate and regular rhythm.     Pulses: Normal pulses.     Heart sounds: Normal heart sounds.  Pulmonary:     Effort: Pulmonary effort is normal.     Breath sounds: Normal breath sounds.  Musculoskeletal:     Cervical back: Neck supple. No rigidity or crepitus. Muscular tenderness present. No pain with movement or spinous process tenderness. Normal range of motion.     Thoracic back: Normal.     Lumbar back: Normal.       Back:  Neurological:     General: No focal deficit present.     Mental Status: She is alert and oriented to person, place, and time.     Cranial Nerves: Cranial nerves are intact.     Coordination: Coordination is intact.  Psychiatric:        Mood and Affect: Mood is anxious.        Behavior: Behavior normal.        Thought Content: Thought content normal.        Judgment: Judgment normal.     Recent Results (from the past 2160 hour(s))  HM MAMMOGRAPHY     Status: None   Collection Time: 11/02/19 12:00 AM  Result Value Ref Range   HM Mammogram Self Reported Normal 0-4 Bi-Rad, Self Reported Normal   Assessment/Plan: 1. Muscle pain Seems related to stress and tension -- gets around R eye, R trapezius and into occiput. Discussed stress relief tactics. Will work on treating anxiety as noted below. Recommend massage, topical BioFreeze and heating pad. Will check CM today to assess electrolytes. - Comprehensive metabolic panel  2. Excessive sweating Suspect secondary to stress and anxiety. No alarm signs or symptoms present. Mother with early onset menopause. Patient s/p hysterectomy with preservation of ovaries. Will check blood count and thyroid panel along with Estradiol/FSH/LH levels today. She  declines medication for hyperhidrosis presently.  - CBC with Differential/Platelet - TSH - T4, free - Estradiol - FSH/LH  3. Anxiety state Feel this is the culprit of all issues discussed today. Labs as noted. Discussed changing SSRI therapy but she declines. Declines counseling. She is very adamant about wanting to restart Xanax which she has taken in the past, noting a multi-year addiction and dependency on the medication but feels she  would be fine restarting. Discussed this is not an option. Will add on short acting hydroxyzine for more acute anxiety. Follow-up with PCP for further management of anxiety.  - CBC with Differential/Platelet - TSH - T4, free - hydrOXYzine (ATARAX/VISTARIL) 10 MG tablet; Take 1 tablet (10 mg total) by mouth 3 (three) times daily as needed.  Dispense: 60 tablet; Refill: 0  This visit occurred during the SARS-CoV-2 public health emergency.  Safety protocols were in place, including screening questions prior to the visit, additional usage of staff PPE, and extensive cleaning of exam room while observing appropriate contact time as indicated for disinfecting solutions.     Leeanne Rio, PA-C

## 2019-11-19 NOTE — Patient Instructions (Signed)
Please go to the lab today for blood work.  I will call you with your results. We will alter treatment regimen(s) if indicated by your results.   Please use the Hydroxyzine no more than as directed when needed for acute anxiety.  I think a lot of this symptoms are related to stress -- seems you are always in a fight or flight mode.  Continue Lexapro as directed.  Continue use of night guard. Recommend routine massages.  Get some topical Biopfreeze to apply to the neck and the facial muscles causing issue to alleviate tension.  Follow-up with Dr. Birdie Riddle in 3-4 weeks.

## 2019-11-20 LAB — FSH/LH
FSH: 5.3 m[IU]/mL
LH: 4.4 m[IU]/mL

## 2019-11-20 LAB — ESTRADIOL: Estradiol: 90 pg/mL

## 2019-11-22 ENCOUNTER — Telehealth: Payer: Self-pay | Admitting: Family Medicine

## 2019-11-22 ENCOUNTER — Encounter: Payer: Self-pay | Admitting: Physician Assistant

## 2019-11-22 NOTE — Telephone Encounter (Signed)
Patient would like to speak to you about symptoms that she is having.  She saw you on Friday 11/19/2019.

## 2019-11-23 ENCOUNTER — Encounter: Payer: Self-pay | Admitting: Family Medicine

## 2019-11-23 NOTE — Telephone Encounter (Signed)
When I saw her we discussed ongoing tension and anxiety, none of current symptoms. If it were just the arm I would say that it could be related to the neck and we should consider imaging. Being it is both the R foot and R arm, unless multiple nerves are irritated. Either way she needs reassessment, preferably with her PCP as she was wanting to discuss chronic issues at her visit with me as well that I do not manage. If not available, I am happy to see her to reassess. If symptoms are severe, recommend Urgent Care.

## 2019-11-23 NOTE — Telephone Encounter (Signed)
Patient was called and documented in another phone note.

## 2019-11-23 NOTE — Telephone Encounter (Signed)
Patient has been contacted.

## 2019-11-23 NOTE — Telephone Encounter (Signed)
Having numbness and tingling in right  foot and right arm. Is constant. Started yesterday. She has been taking medications and was able to get a massage but did not change symptoms.  Has been taking the Lexapro as directed and Hydroxyzine as directed.

## 2019-11-24 NOTE — Telephone Encounter (Signed)
Whole discussion was had at visit about ongoing eye upper eye tension and periorbital tension/spasm.  Neck very tight on examination which she agreed with and was very adamant about anxiety medication due to stress/tension and anxiety -- this is why she requested Xanax despite history of abuse (it was explained to her why this would not be restarted). Hydroxyzine was given instead for acute anxiety since she refused SSRI/SNRI.   Very well could be that she has nerve irritation, etc but frequent periorbital tension and elbow pain are not typical signs of acute neurological event.   If she does not want further assessment now or imaging and wants to wait until appt with PCP then that is her decision.

## 2019-11-24 NOTE — Telephone Encounter (Signed)
Hx of TIA in 2010 which made her concerned when she was having the symptoms. She denies any vision changes, slurred speech or facial changes. Today she states she is only having right elbow and right eyebrow. She states the eyebrow is consistent. Advised patient that Einar Pheasant could get imaging since she is having tension and tightness of the neck and upper back. She doesn't want any imaging at this time. She will wait until her appointment with Dr Birdie Riddle on 12/10/19.  She doesn't believe stress and tension could be causing her symptoms.

## 2019-12-10 ENCOUNTER — Ambulatory Visit: Payer: BC Managed Care – PPO | Admitting: Family Medicine

## 2020-02-04 ENCOUNTER — Ambulatory Visit: Payer: BC Managed Care – PPO | Admitting: Family Medicine

## 2020-02-08 DIAGNOSIS — D485 Neoplasm of uncertain behavior of skin: Secondary | ICD-10-CM | POA: Diagnosis not present

## 2020-02-08 DIAGNOSIS — L209 Atopic dermatitis, unspecified: Secondary | ICD-10-CM | POA: Diagnosis not present

## 2020-03-07 ENCOUNTER — Other Ambulatory Visit: Payer: Self-pay | Admitting: Family Medicine

## 2020-04-08 ENCOUNTER — Telehealth: Payer: BC Managed Care – PPO | Admitting: Orthopedic Surgery

## 2020-04-08 DIAGNOSIS — R197 Diarrhea, unspecified: Secondary | ICD-10-CM

## 2020-04-08 MED ORDER — AZITHROMYCIN 500 MG PO TABS: 500.0000 mg | ORAL_TABLET | Freq: Every day | ORAL | 0 refills | Status: AC

## 2020-04-08 NOTE — Progress Notes (Signed)
We are sorry that you are not feeling well.  Here is how we plan to help!  Based on what you have shared with me it looks like you have Acute Infectious Diarrhea.  Most cases of acute diarrhea are due to infections with virus and bacteria and are self-limited conditions lasting less than 14 days.  For your symptoms you may take Imodium 2 mg tablets that are over the counter at your local pharmacy. Take two tablet now and then one after each loose stool up to 6 a day.  Antibiotics are not needed for most people with diarrhea.  Optional: I have prescribed azithromycin 500 mg daily for 3 days  HOME CARE  We recommend changing your diet to help with your symptoms for the next few days.  Drink plenty of fluids that contain water salt and sugar. Sports drinks such as Gatorade may help.   You may try broths, soups, bananas, applesauce, soft breads, mashed potatoes or crackers.   You are considered infectious for as long as the diarrhea continues. Hand washing or use of alcohol based hand sanitizers is recommend.  It is best to stay out of work or school until your symptoms stop.   GET HELP RIGHT AWAY  If you have dark yellow colored urine or do not pass urine frequently you should drink more fluids.    If your symptoms worsen   If you feel like you are going to pass out (faint)  You have a new problem  MAKE SURE YOU   Understand these instructions.  Will watch your condition.  Will get help right away if you are not doing well or get worse.  Your e-visit answers were reviewed by a board certified advanced clinical practitioner to complete your personal care plan.  Depending on the condition, your plan could have included both over the counter or prescription medications.  If there is a problem please reply  once you have received a response from your provider.  Your safety is important to [place].  If you have drug allergies check your prescription carefully.    You can use MyChart  to ask questions about today's visit, request a non-urgent call back, or ask for a work or school excuse for 24 hours related to this e-Visit. If it has been greater than 24 hours you will need to follow up with your provider, or enter a new e-Visit to address those concerns.   You will get an e-mail in the next two days asking about your experience.  I hope that your e-visit has been valuable and will speed your recovery. Thank you for using e-visits.  Greater than 5 minutes, yet less than 10 minutes of time have been spent researching, coordinating and implementing care for this patient today.

## 2020-04-13 DIAGNOSIS — R197 Diarrhea, unspecified: Secondary | ICD-10-CM | POA: Diagnosis not present

## 2020-04-14 DIAGNOSIS — R197 Diarrhea, unspecified: Secondary | ICD-10-CM | POA: Diagnosis not present

## 2020-05-29 DIAGNOSIS — M25512 Pain in left shoulder: Secondary | ICD-10-CM | POA: Diagnosis not present

## 2020-06-26 DIAGNOSIS — S6992XA Unspecified injury of left wrist, hand and finger(s), initial encounter: Secondary | ICD-10-CM | POA: Diagnosis not present

## 2020-06-26 DIAGNOSIS — M25512 Pain in left shoulder: Secondary | ICD-10-CM | POA: Diagnosis not present

## 2020-07-05 DIAGNOSIS — M79642 Pain in left hand: Secondary | ICD-10-CM | POA: Diagnosis not present

## 2020-07-15 DIAGNOSIS — M25512 Pain in left shoulder: Secondary | ICD-10-CM | POA: Diagnosis not present

## 2020-09-07 ENCOUNTER — Other Ambulatory Visit: Payer: Self-pay | Admitting: Family Medicine

## 2020-09-27 ENCOUNTER — Encounter: Payer: Self-pay | Admitting: *Deleted

## 2020-09-27 DIAGNOSIS — D485 Neoplasm of uncertain behavior of skin: Secondary | ICD-10-CM | POA: Diagnosis not present

## 2020-09-27 DIAGNOSIS — L821 Other seborrheic keratosis: Secondary | ICD-10-CM | POA: Diagnosis not present

## 2020-09-27 DIAGNOSIS — L570 Actinic keratosis: Secondary | ICD-10-CM | POA: Diagnosis not present

## 2020-09-27 DIAGNOSIS — D1801 Hemangioma of skin and subcutaneous tissue: Secondary | ICD-10-CM | POA: Diagnosis not present

## 2020-09-27 DIAGNOSIS — C44212 Basal cell carcinoma of skin of right ear and external auricular canal: Secondary | ICD-10-CM | POA: Diagnosis not present

## 2020-10-03 ENCOUNTER — Other Ambulatory Visit: Payer: Self-pay | Admitting: Family Medicine

## 2020-10-09 DIAGNOSIS — A09 Infectious gastroenteritis and colitis, unspecified: Secondary | ICD-10-CM | POA: Diagnosis not present

## 2020-10-13 DIAGNOSIS — C44212 Basal cell carcinoma of skin of right ear and external auricular canal: Secondary | ICD-10-CM | POA: Diagnosis not present

## 2020-10-31 ENCOUNTER — Other Ambulatory Visit: Payer: Self-pay | Admitting: Family Medicine

## 2020-12-01 ENCOUNTER — Other Ambulatory Visit: Payer: Self-pay | Admitting: Family Medicine

## 2020-12-06 ENCOUNTER — Telehealth: Payer: Self-pay | Admitting: Family Medicine

## 2020-12-06 NOTE — Telephone Encounter (Signed)
Pt is calling for follow up on Medication request for refill

## 2020-12-08 ENCOUNTER — Other Ambulatory Visit: Payer: Self-pay | Admitting: Family Medicine

## 2020-12-08 ENCOUNTER — Telehealth: Payer: Self-pay

## 2020-12-08 MED ORDER — ROSUVASTATIN CALCIUM 20 MG PO TABS
20.0000 mg | ORAL_TABLET | Freq: Every day | ORAL | 0 refills | Status: DC
Start: 2020-12-08 — End: 2021-01-02

## 2020-12-08 NOTE — Telephone Encounter (Signed)
refill 

## 2020-12-15 NOTE — Telephone Encounter (Signed)
Appt made for 10-19

## 2021-01-02 ENCOUNTER — Other Ambulatory Visit: Payer: Self-pay | Admitting: Family Medicine

## 2021-01-17 ENCOUNTER — Ambulatory Visit: Payer: BC Managed Care – PPO | Admitting: Family Medicine

## 2021-01-17 ENCOUNTER — Other Ambulatory Visit: Payer: Self-pay

## 2021-01-17 ENCOUNTER — Encounter: Payer: Self-pay | Admitting: Family Medicine

## 2021-01-17 VITALS — BP 110/78 | HR 82 | Temp 97.9°F | Resp 16 | Ht 70.0 in | Wt 234.0 lb

## 2021-01-17 DIAGNOSIS — E669 Obesity, unspecified: Secondary | ICD-10-CM | POA: Diagnosis not present

## 2021-01-17 DIAGNOSIS — Z8249 Family history of ischemic heart disease and other diseases of the circulatory system: Secondary | ICD-10-CM | POA: Diagnosis not present

## 2021-01-17 DIAGNOSIS — Z0000 Encounter for general adult medical examination without abnormal findings: Secondary | ICD-10-CM

## 2021-01-17 DIAGNOSIS — E785 Hyperlipidemia, unspecified: Secondary | ICD-10-CM | POA: Diagnosis not present

## 2021-01-17 MED ORDER — ROSUVASTATIN CALCIUM 20 MG PO TABS: 20.0000 mg | ORAL_TABLET | Freq: Every day | ORAL | 1 refills | Status: DC

## 2021-01-17 NOTE — Patient Instructions (Signed)
Follow up in 6 months to recheck cholesterol We'll notify you of your lab results and make any changes if needed Continue to work on healthy diet and regular exercise- you can do it! We'll call you with your cardiology appt Try and work on stress management to improve your overall wellbeing Call with any questions or concerns Stay Safe!  Stay Healthy! Happy Fall!!

## 2021-01-17 NOTE — Progress Notes (Signed)
   Subjective:    Patient ID: Angie Martin, female    DOB: Mar 23, 1976, 45 y.o.   MRN: [id number]  HPI CPE- UTD on pap, Tdap.  Due for colon cancer screen (scheduled Monday) and mammo (scheduled for December)  UTD on flu and COVID booster  Patient Care Team    Relationship Specialty Notifications Start End  Midge Minium, MD PCP - General Family Medicine  01/28/19   Bobbye Charleston, MD Consulting Physician Obstetrics and Gynecology  01/28/19     Health Maintenance  Topic Date Due   HIV Screening  Never done   Hepatitis C Screening  Never done   COVID-19 Vaccine (3 - Booster for Pfizer series) 12/05/2019   INFLUENZA VACCINE  10/30/2020   COLONOSCOPY (Pts 45-42yrs Insurance coverage will need to be confirmed)  01/22/2021 (Originally 10/06/2018)   PAP SMEAR-Modifier  11/02/2022   TETANUS/TDAP  01/27/2029   Pneumococcal Vaccine 19-24 Years old  Aged Out   HPV VACCINES  Aged Out      Review of Systems Patient reports no vision/ hearing changes, adenopathy,fever, weight change,  persistant/recurrent hoarseness , swallowing issues, chest pain, palpitations, edema, hemoptysis, dyspnea (rest/exertional/paroxysmal nocturnal), gastrointestinal bleeding (melena, rectal bleeding), abdominal pain, significant heartburn, bowel changes, GU symptoms (dysuria, hematuria, incontinence), Gyn symptoms (abnormal  bleeding, pain),  syncope, focal weakness, memory loss, numbness & tingling, skin/hair/nail changes, abnormal bruising or bleeding.   + family hx of early CAD + ongoing stress/anxiety- hx of xanax abuse but states this was the only thing that helped + cough x1 year  This visit occurred during the SARS-CoV-2 public health emergency.  Safety protocols were in place, including screening questions prior to the visit, additional usage of staff PPE, and extensive cleaning of exam room while observing appropriate contact time as indicated for disinfecting solutions.      Objective:   Physical  Exam General Appearance:    Alert, cooperative, no distress, appears stated age  Head:    Normocephalic, without obvious abnormality, atraumatic  Eyes:    PERRL, conjunctiva/corneas clear, EOM's intact, fundi    benign, both eyes  Ears:    Normal TM's and external ear canals, both ears  Nose:   Deferred due to COVID  Throat:   Neck:   Supple, symmetrical, trachea midline, no adenopathy;    Thyroid: no enlargement/tenderness/nodules  Back:     Symmetric, no curvature, ROM normal, no CVA tenderness  Lungs:     Clear to auscultation bilaterally, respirations unlabored  Chest Wall:    No tenderness or deformity   Heart:    Regular rate and rhythm, S1 and S2 normal, no murmur, rub   or gallop  Breast Exam:    Deferred to GYN  Abdomen:     Soft, non-tender, bowel sounds active all four quadrants,    no masses, no organomegaly  Genitalia:    Deferred to GYN  Rectal:    Extremities:   Extremities normal, atraumatic, no cyanosis or edema  Pulses:   2+ and symmetric all extremities  Skin:   Skin color, texture, turgor normal, no rashes or lesions  Lymph nodes:   Cervical, supraclavicular, and axillary nodes normal  Neurologic:   CNII-XII intact, normal strength, sensation and reflexes    throughout          Assessment & Plan:

## 2021-01-18 LAB — CBC WITH DIFFERENTIAL/PLATELET
Basophils Absolute: 0.1 10*3/uL (ref 0.0–0.1)
Basophils Relative: 1.3 % (ref 0.0–3.0)
Eosinophils Absolute: 0.1 10*3/uL (ref 0.0–0.7)
Eosinophils Relative: 1.8 % (ref 0.0–5.0)
HCT: 41.5 % (ref 36.0–46.0)
Hemoglobin: 13.8 g/dL (ref 12.0–15.0)
Lymphocytes Relative: 36.8 % (ref 12.0–46.0)
Lymphs Abs: 2.8 10*3/uL (ref 0.7–4.0)
MCHC: 33.3 g/dL (ref 30.0–36.0)
MCV: 89.1 fl (ref 78.0–100.0)
Monocytes Absolute: 0.5 10*3/uL (ref 0.1–1.0)
Monocytes Relative: 6.3 % (ref 3.0–12.0)
Neutro Abs: 4.1 10*3/uL (ref 1.4–7.7)
Neutrophils Relative %: 53.8 % (ref 43.0–77.0)
Platelets: 184 10*3/uL (ref 150.0–400.0)
RBC: 4.66 Mil/uL (ref 3.87–5.11)
RDW: 13.2 % (ref 11.5–15.5)
WBC: 7.7 10*3/uL (ref 4.0–10.5)

## 2021-01-18 LAB — LDL CHOLESTEROL, DIRECT: Direct LDL: 76 mg/dL

## 2021-01-18 LAB — HEPATIC FUNCTION PANEL
ALT: 24 U/L (ref 0–35)
AST: 20 U/L (ref 0–37)
Albumin: 4.8 g/dL (ref 3.5–5.2)
Alkaline Phosphatase: 55 U/L (ref 39–117)
Bilirubin, Direct: 0.2 mg/dL (ref 0.0–0.3)
Total Bilirubin: 1 mg/dL (ref 0.2–1.2)
Total Protein: 7 g/dL (ref 6.0–8.3)

## 2021-01-18 LAB — TSH: TSH: 2.64 u[IU]/mL (ref 0.35–5.50)

## 2021-01-18 LAB — LIPID PANEL
Cholesterol: 149 mg/dL (ref 0–200)
HDL: 46.2 mg/dL (ref >39.00)
NonHDL: 102.37
Total CHOL/HDL Ratio: 3
Triglycerides: 220 mg/dL — ABNORMAL HIGH (ref 0.0–149.0)
VLDL: 44 mg/dL — ABNORMAL HIGH (ref 0.0–40.0)

## 2021-01-18 LAB — BASIC METABOLIC PANEL
BUN: 10 mg/dL (ref 6–23)
CO2: 26 mEq/L (ref 19–32)
Calcium: 9.4 mg/dL (ref 8.4–10.5)
Chloride: 102 mEq/L (ref 96–112)
Creatinine, Ser: 0.69 mg/dL (ref 0.40–1.20)
GFR: 103.45 mL/min (ref >60.00)
Glucose, Bld: 86 mg/dL (ref 70–99)
Potassium: 4 mEq/L (ref 3.5–5.1)
Sodium: 137 mEq/L (ref 135–145)

## 2021-01-18 LAB — VITAMIN D 25 HYDROXY (VIT D DEFICIENCY, FRACTURES): VITD: 22.98 ng/mL — ABNORMAL LOW (ref 30.00–100.00)

## 2021-01-18 LAB — HEMOGLOBIN A1C: Hgb A1c MFr Bld: 5.8 % (ref 4.6–6.5)

## 2021-01-19 ENCOUNTER — Other Ambulatory Visit: Payer: Self-pay

## 2021-01-19 ENCOUNTER — Encounter: Payer: Self-pay | Admitting: Family Medicine

## 2021-01-19 DIAGNOSIS — E559 Vitamin D deficiency, unspecified: Secondary | ICD-10-CM

## 2021-01-19 MED ORDER — VITAMIN D (ERGOCALCIFEROL) 1.25 MG (50000 UNIT) PO CAPS: 50000.0000 [IU] | ORAL_CAPSULE | ORAL | 0 refills | Status: DC

## 2021-01-22 ENCOUNTER — Encounter: Payer: Self-pay | Admitting: Family Medicine

## 2021-01-22 DIAGNOSIS — Z1211 Encounter for screening for malignant neoplasm of colon: Secondary | ICD-10-CM | POA: Diagnosis not present

## 2021-01-22 LAB — HM COLONOSCOPY

## 2021-01-24 ENCOUNTER — Encounter: Payer: Self-pay | Admitting: Family Medicine

## 2021-01-28 NOTE — Assessment & Plan Note (Signed)
Chronic problem.  Tolerating Crestor w/o difficulty.  Check labs.  Adjust meds prn  

## 2021-01-28 NOTE — Assessment & Plan Note (Signed)
Pt's PE unchanged from previous.  UTD on pap, Tdap.  Colonoscopy and mammo scheduled.  UTD on flu and COVID.  Check labs.  Anticipatory guidance provided.

## 2021-01-28 NOTE — Assessment & Plan Note (Signed)
Due to family hx of early CAD and her personal hx of dyslipidemia will refer to cards for complete evaluation and risk stratification.  Pt expressed understanding and is in agreement w/ plan.

## 2021-01-28 NOTE — Assessment & Plan Note (Signed)
Ongoing issue for pt.  Encouraged healthy diet and regular exercise.  Check labs to risk stratify.  Will follow. 

## 2021-02-01 ENCOUNTER — Encounter: Payer: Self-pay | Admitting: Family Medicine

## 2021-03-01 NOTE — Progress Notes (Signed)
Cardiology Office Note:    Date:  03/02/2021   ID:  Angie Martin, DOB [birth year], MRN [id number]  PCP:  Midge Minium, MD  Cardiologist:  None  Electrophysiologist:  None   Referring MD: Midge Minium, MD   Chief Complaint/Reason for Referral: Hyperlipidemia and FH of early coronary artery disease; risk stratification  History of Present Illness:     Angie Martin is a 45 y.o. female with the indicated history here for recommendations regarding patient's family history of coronary artery disease and dyslipidemia.  The patient is on Crestor and a recent lipid panel demonstrated an LDL that was 67.  Her triglycerides were elevated.  She had a stress test done a few years ago which demonstrated no ischemia.  Since that time she has not really exercised as much.  She is busy with work in an [employer].  She fortunately denies any exertional angina or severe exertional dyspnea.  She has had no presyncope, syncope, signs or symptoms of stroke, or symptoms suggestive of sleep apnea.  She has required no emergency room visits or hospitalizations.  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   ASD (atrial septal defect)    repaired in 2010 at [place] - 68mm Helix ASD Closure   Depression    no meds - "eat pot cookies"   Fibroids    High cholesterol    diet controlled, no meds   Ocular migraine    otc meds prn   TIA (transient ischemic attack) 06/2008   mild- surgery for ASD closure    Past Surgical History:  Procedure Laterality Date   ASD REPAIR     bone spur removal Left 2001   CYSTOSCOPY  09/27/2015   Procedure: CYSTOSCOPY;  Surgeon: Bobbye Charleston, MD;  Location: [facility];  Service: Gynecology;;   Helix device  2010   at [place] - 30 mm Helix ASD Closure   ROBOTIC ASSISTED TOTAL HYSTERECTOMY N/A 09/27/2015   Procedure: ROBOTIC ASSISTED TOTAL HYSTERECTOMY, Bilateral Salpingectomy;  Surgeon: Bobbye Charleston, MD;  Location: [facility];  Service: Gynecology;  Laterality: N/A;   TONSILLECTOMY  1993   vein laser surgery  2016   bilateral legs   WISDOM TOOTH EXTRACTION      Current Medications: Current Meds  Medication Sig   aspirin EC 81 MG tablet Take 81 mg by mouth daily. Swallow whole.   rosuvastatin (CRESTOR) 20 MG tablet Take 1 tablet (20 mg total) by mouth daily.   Vitamin D, Ergocalciferol, (DRISDOL) 1.25 MG (50000 UNIT) CAPS capsule Take 1 capsule (50,000 Units total) by mouth every 7 (seven) days.     Allergies:    Allergies  Allergen Reactions   Fentanyl Itching    Social History   Tobacco Use   Smoking status: Never   Smokeless tobacco: Never  Substance Use Topics   Alcohol use: Yes    Alcohol/week: 11.0 standard drinks    Types: 9 Shots of liquor, 2 Standard drinks or equivalent per week   Drug use: Yes    Types: Marijuana    Comment: "eats pot cookies" last used 6/11/7     Family History: Family History  Problem Relation Age of Onset   Hypertension Mother    Coronary artery disease Mother        MI   Coronary artery disease Maternal Grandmother    Hypertension Maternal Grandmother    ALS Maternal Grandmother  ROS:   Please see the history of present illness.    All other systems reviewed and are negative.  EKGs/Labs/Other Studies Reviewed:    The following studies were reviewed today:  EKG: EKG today demonstrates sinus rhythm  Imaging studies that I have independently reviewed today:   ETT 2020 ECG Baseline ECG exhibits normal sinus rhythm..  Stress Findings The patient exercised following the Bruce protocol.   The patient reported no symptoms during the stress test. The patient experienced no angina during the stress test.   The test was stopped because  the patient complained of fatigue and shortness of breath.   Heart rate demonstrated a normal response to exercise. Blood pressure demonstrated a blunted response to exercise. Overall,  the patient's exercise capacity was excellent.   85% of maximum heart rate was achieved after 9.2 minutes.  Recovery time:  9 minutes.  The patient's response to exercise was adequate for diagnosis.  Response to Stress Upsloping ST segment depression  ST segment depression was noted during stress in the II, III, aVF, V6 and V5 leads, and returning to baseline after 1-5 minutes of recovery.  No T wave inversion was noted during stress.  Arrhythmias during stress:  none.   Arrhythmias during recovery:  none.     There were no significant arrhythmias noted during the test.   ECG was interpretable and there was no significant change from baseline.    Recent Labs: 01/17/2021: ALT 24; BUN 10; Creatinine, Ser 0.69; Hemoglobin 13.8; Platelets 184.0; Potassium 4.0; Sodium 137; TSH 2.64  Recent Lipid Panel    Component Value Date/Time   CHOL 149 01/17/2021 1453   TRIG 220.0 (H) 01/17/2021 1453   HDL 46.20 01/17/2021 1453   CHOLHDL 3 01/17/2021 1453   VLDL 44.0 (H) 01/17/2021 1453   LDLCALC 67 08/04/2019 0957   LDLDIRECT 76.0 01/17/2021 1453    Physical Exam:    VS:  BP 122/80   Pulse 83   Ht 5\' 10"  (1.778 m)   Wt 236 lb 6.4 oz (107.2 kg)   LMP 09/12/2015 (Approximate)   SpO2 96%   BMI 33.92 kg/m     Wt Readings from Last 5 Encounters:  03/02/21 236 lb 6.4 oz (107.2 kg)  01/17/21 234 lb (106.1 kg)  11/19/19 236 lb 3.2 oz (107.1 kg)  08/04/19 229 lb 2 oz (103.9 kg)  01/28/19 222 lb 8 oz (100.9 kg)    GENERAL:  No apparent distress, AOx3 HEENT:  No carotid bruits, +2 carotid impulses, no scleral icterus CAR: RRR no murmurs, gallops, rubs, or thrills RES:  Clear to auscultation bilaterally ABD:  Soft, nontender, nondistended, positive bowel sounds x 4 VASC:  +2 radial pulses, +2 carotid pulses, palpable pedal pulses NEURO:  CN 2-12 grossly intact; motor and sensory grossly intact PSYCH:  No active depression or anxiety EXT:  No edema, ecchymosis, or cyanosis  ASSESSMENT:    1.  Hyperlipidemia, unspecified hyperlipidemia type   2. Family history of premature CAD    PLAN:    Hyperlipidemia, unspecified hyperlipidemia type LDL is at goal, continue crestor.  Agree with diet and exercise for triglycerides and if needed in the future,  consider fish oil or Lovaza.  The data regarding control of TG is less compelling than LDL control in regards to cardiovascular outcomes.    Family history of premature CAD She had a stress test a few years ago that was reassuring.  Continue aspirin due to history of stroke (would otherwise stop this  as no compelling primary prevention indication for aspirin).  Normally a CT scan would be pursued and a discussion about statin therapy after that.  Given the fact that she is agreeable to statin therapy I think it makes sense to forego the CT scan.  I will obtain an echocardiogram to evaluate further.  Will keep follow up with me open-ended.   Shared Decision Making/Informed Consent:       Medication Adjustments/Labs and Tests Ordered: Current medicines are reviewed at length with the patient today.  Concerns regarding medicines are outlined above.   Orders Placed This Encounter  Procedures   EKG 12-Lead   ECHOCARDIOGRAM COMPLETE     No orders of the defined types were placed in this encounter.   Patient Instructions  Medication Instructions:  Your physician recommends that you continue on your current medications as directed. Please refer to the Current Medication list given to you today.  *If you need a refill on your cardiac medications before your next appointment, please call your pharmacy*   Lab Work: None If you have labs (blood work) drawn today and your tests are completely normal, you will receive your results only by: [place] (if you have MyChart) OR A paper copy in the mail If you have any lab test that is abnormal or we need to change your treatment, we will call you to review the  results.   Testing/Procedures: Your physician has requested that you have an echocardiogram. Echocardiography is a painless test that uses sound waves to create images of your heart. It provides your doctor with information about the size and shape of your heart and how well your heart's chambers and valves are working. This procedure takes approximately one hour. There are no restrictions for this procedure.   Follow-Up: At [facility], you and your health needs are our priority.  As part of our continuing mission to provide you with exceptional heart care, we have created designated Provider Care Teams.  These Care Teams include your primary Cardiologist (physician) and Advanced Practice Providers (APPs -  Physician Assistants and Nurse Practitioners) who all work together to provide you with the care you need, when you need it.  We recommend signing up for the patient portal called "MyChart".  Sign up information is provided on this After Visit Summary.  MyChart is used to connect with patients for Virtual Visits (Telemedicine).  Patients are able to view lab/test results, encounter notes, upcoming appointments, etc.  Non-urgent messages can be sent to your provider as well.   To learn more about what you can do with MyChart, go to [email or web address].    Your next appointment:   As needed  The format for your next appointment:   In Person  Provider:   Lenna Sciara, MD    Other Instructions

## 2021-03-02 ENCOUNTER — Encounter: Payer: Self-pay | Admitting: Internal Medicine

## 2021-03-02 ENCOUNTER — Ambulatory Visit: Payer: BC Managed Care – PPO | Admitting: Internal Medicine

## 2021-03-02 ENCOUNTER — Other Ambulatory Visit: Payer: Self-pay

## 2021-03-02 VITALS — BP 122/80 | HR 83 | Ht 70.0 in | Wt 236.4 lb

## 2021-03-02 DIAGNOSIS — E785 Hyperlipidemia, unspecified: Secondary | ICD-10-CM | POA: Diagnosis not present

## 2021-03-02 DIAGNOSIS — Z8249 Family history of ischemic heart disease and other diseases of the circulatory system: Secondary | ICD-10-CM

## 2021-03-02 NOTE — Patient Instructions (Signed)
Medication Instructions:  Your physician recommends that you continue on your current medications as directed. Please refer to the Current Medication list given to you today.  *If you need a refill on your cardiac medications before your next appointment, please call your pharmacy*   Lab Work: None If you have labs (blood work) drawn today and your tests are completely normal, you will receive your results only by: [place] (if you have MyChart) OR A paper copy in the mail If you have any lab test that is abnormal or we need to change your treatment, we will call you to review the results.   Testing/Procedures: Your physician has requested that you have an echocardiogram. Echocardiography is a painless test that uses sound waves to create images of your heart. It provides your doctor with information about the size and shape of your heart and how well your heart's chambers and valves are working. This procedure takes approximately one hour. There are no restrictions for this procedure.   Follow-Up: At [facility], you and your health needs are our priority.  As part of our continuing mission to provide you with exceptional heart care, we have created designated Provider Care Teams.  These Care Teams include your primary Cardiologist (physician) and Advanced Practice Providers (APPs -  Physician Assistants and Nurse Practitioners) who all work together to provide you with the care you need, when you need it.  We recommend signing up for the patient portal called "MyChart".  Sign up information is provided on this After Visit Summary.  MyChart is used to connect with patients for Virtual Visits (Telemedicine).  Patients are able to view lab/test results, encounter notes, upcoming appointments, etc.  Non-urgent messages can be sent to your provider as well.   To learn more about what you can do with MyChart, go to [email or web address].    Your next appointment:   As needed  The  format for your next appointment:   In Person  Provider:   Lenna Sciara, MD    Other Instructions

## 2021-03-06 DIAGNOSIS — Z1231 Encounter for screening mammogram for malignant neoplasm of breast: Secondary | ICD-10-CM | POA: Diagnosis not present

## 2021-03-06 DIAGNOSIS — Z01419 Encounter for gynecological examination (general) (routine) without abnormal findings: Secondary | ICD-10-CM | POA: Diagnosis not present

## 2021-03-20 ENCOUNTER — Ambulatory Visit (HOSPITAL_COMMUNITY): Payer: BC Managed Care – PPO | Attending: Internal Medicine

## 2021-03-20 ENCOUNTER — Other Ambulatory Visit: Payer: Self-pay

## 2021-03-20 DIAGNOSIS — Z8249 Family history of ischemic heart disease and other diseases of the circulatory system: Secondary | ICD-10-CM | POA: Diagnosis not present

## 2021-03-20 DIAGNOSIS — E785 Hyperlipidemia, unspecified: Secondary | ICD-10-CM

## 2021-03-20 LAB — ECHOCARDIOGRAM COMPLETE
Area-P 1/2: 3.89 cm2
S' Lateral: 3.3 cm

## 2021-04-13 ENCOUNTER — Telehealth: Payer: Self-pay

## 2021-04-13 NOTE — Telephone Encounter (Signed)
Pt is aware and will go to be seen at an urgent care soon

## 2021-04-13 NOTE — Telephone Encounter (Signed)
Pt should be evaluated in person soon given sxs. Within [place] or Urgent care recommended

## 2021-04-13 NOTE — Telephone Encounter (Signed)
Caller name:Saydee Ksiazek   On DPR? :yes Call back [phone]  Provider they see: Birdie Riddle   Reason for call:Bloating and frequent urination advise on what she needs to do. She said even driving around hitting bumps it hurts. Advice to be seen

## 2021-04-17 ENCOUNTER — Encounter: Payer: Self-pay | Admitting: Registered Nurse

## 2021-04-17 ENCOUNTER — Ambulatory Visit: Payer: BC Managed Care – PPO | Admitting: Registered Nurse

## 2021-04-17 VITALS — BP 124/70 | HR 65 | Temp 98.1°F | Resp 16 | Ht 70.0 in | Wt 232.4 lb

## 2021-04-17 DIAGNOSIS — R350 Frequency of micturition: Secondary | ICD-10-CM

## 2021-04-17 DIAGNOSIS — R3129 Other microscopic hematuria: Secondary | ICD-10-CM

## 2021-04-17 LAB — POCT URINALYSIS DIP (MANUAL ENTRY)
Bilirubin, UA: NEGATIVE
Glucose, UA: NEGATIVE mg/dL
Ketones, POC UA: NEGATIVE mg/dL
Leukocytes, UA: NEGATIVE
Nitrite, UA: NEGATIVE
Protein Ur, POC: NEGATIVE mg/dL
Spec Grav, UA: 1.02 (ref 1.010–1.025)
Urobilinogen, UA: 0.2 E.U./dL
pH, UA: 6 (ref 5.0–8.0)

## 2021-04-17 MED ORDER — SULFAMETHOXAZOLE-TRIMETHOPRIM 800-160 MG PO TABS: 1.0000 | ORAL_TABLET | Freq: Two times a day (BID) | ORAL | 0 refills | Status: DC

## 2021-04-17 NOTE — Patient Instructions (Signed)
Ms. Cargo -   Doristine Devoid to see you  Let me know if symptoms worsen or fail to improve  Take bactrim - finish course even if you're feeling better.  Bland foods next 2-3 days to give bowels a break  I will let you know if urine culture shows concerning results  Thank you  Denice Paradise

## 2021-04-17 NOTE — Progress Notes (Signed)
Established Patient Office Visit  Subjective:  Patient ID: Angie Martin, female    DOB: [birth year]  Age: 46 y.o. MRN: [id number]  CC:  Chief Complaint  Patient presents with   Bloated    Pt was bloated starting 1 week ago, has had normal BM feels she has had more frequent urination, no pain, notes some bladder pressure     HPI Angie Martin presents for bloating  Frequent urination, pressure in bladder and suprapubic pressure.  Feels bloating, normal BM.  No dysuria or gross hematuria.  Denies flank pain, back pain, nvd, fevers, chills, sweats.   Has been taking gas-x, acid reducer, pepto bismol, notes some relief but limited.   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   ASD (atrial septal defect)    repaired in 2010 at [place] - 70mm Helix ASD Closure   Depression    no meds - "eat pot cookies"   Fibroids    High cholesterol    diet controlled, no meds   Ocular migraine    otc meds prn   TIA (transient ischemic attack) 06/2008   mild- surgery for ASD closure    Past Surgical History:  Procedure Laterality Date   ASD REPAIR     bone spur removal Left 2001   CYSTOSCOPY  09/27/2015   Procedure: CYSTOSCOPY;  Surgeon: Bobbye Charleston, MD;  Location: [facility];  Service: Gynecology;;   Helix device  2010   at [place] - 30 mm Helix ASD Closure   ROBOTIC ASSISTED TOTAL HYSTERECTOMY N/A 09/27/2015   Procedure: ROBOTIC ASSISTED TOTAL HYSTERECTOMY, Bilateral Salpingectomy;  Surgeon: Bobbye Charleston, MD;  Location: [facility];  Service: Gynecology;  Laterality: N/A;   TONSILLECTOMY  1993   vein laser surgery  2016   bilateral legs   WISDOM TOOTH EXTRACTION      Family History  Problem Relation Age of Onset   Hypertension Mother    Coronary artery disease Mother        MI   Coronary artery disease Maternal Grandmother    Hypertension Maternal Grandmother    ALS Maternal Grandmother     Social History    Socioeconomic History   Marital status: Soil scientist    Spouse name: Not on file   Number of children: 0   Years of education: college   Highest education level: Not on file  Occupational History   Occupation: Insurance account manager: OTHER    Comment: [employer]  Tobacco Use   Smoking status: Never   Smokeless tobacco: Never  Substance and Sexual Activity   Alcohol use: Yes    Alcohol/week: 11.0 standard drinks    Types: 9 Shots of liquor, 2 Standard drinks or equivalent per week   Drug use: Yes    Types: Marijuana    Comment: "eats pot cookies" last used 6/11/7   Sexual activity: Yes    Partners: Female    Birth control/protection: None    Comment: Partner- Psychiatrist  Other Topics Concern   Not on file  Social History Narrative   Partner- Psychiatrist.  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  No children.      epworth sleepiness scale = 4 (06/28/2015)   Social Determinants of Health   Financial Resource Strain: Not on file  Food Insecurity: Not on file  Transportation Needs: Not on file  Physical Activity: Not on file  Stress: Not  on file  Social Connections: Not on file  Intimate Partner Violence: Not on file    Outpatient Medications Prior to Visit  Medication Sig Dispense Refill   aspirin EC 81 MG tablet Take 81 mg by mouth daily. Swallow whole.     rosuvastatin (CRESTOR) 20 MG tablet Take 1 tablet (20 mg total) by mouth daily. 90 tablet 1   Vitamin D, Ergocalciferol, (DRISDOL) 1.25 MG (50000 UNIT) CAPS capsule Take 1 capsule (50,000 Units total) by mouth every 7 (seven) days. 12 capsule 0   No facility-administered medications prior to visit.    Allergies  Allergen Reactions   Fentanyl Itching    ROS Review of Systems  Constitutional: Negative.   HENT: Negative.    Eyes: Negative.   Respiratory: Negative.    Cardiovascular: Negative.   Gastrointestinal: Negative.   Genitourinary: Negative.   Musculoskeletal: Negative.   Skin: Negative.    Neurological: Negative.   Psychiatric/Behavioral: Negative.    All other systems reviewed and are negative.    Objective:    Physical Exam Vitals and nursing note reviewed.  Constitutional:      General: She is not in acute distress.    Appearance: Normal appearance. She is normal weight. She is not ill-appearing, toxic-appearing or diaphoretic.  Cardiovascular:     Rate and Rhythm: Normal rate and regular rhythm.     Heart sounds: Normal heart sounds. No murmur heard.   No friction rub. No gallop.  Pulmonary:     Effort: Pulmonary effort is normal. No respiratory distress.     Breath sounds: Normal breath sounds. No stridor. No wheezing, rhonchi or rales.  Chest:     Chest wall: No tenderness.  Skin:    General: Skin is warm and dry.  Neurological:     General: No focal deficit present.     Mental Status: She is alert and oriented to person, place, and time. Mental status is at baseline.  Psychiatric:        Mood and Affect: Mood normal.        Behavior: Behavior normal.        Thought Content: Thought content normal.        Judgment: Judgment normal.    BP 124/70    Pulse 65    Temp 98.1 F (36.7 C) (Temporal)    Resp 16    Ht 5\' 10"  (1.778 m)    Wt 232 lb 6.4 oz (105.4 kg)    LMP 09/12/2015 (Approximate)    SpO2 97%    BMI 33.35 kg/m  Wt Readings from Last 3 Encounters:  04/17/21 232 lb 6.4 oz (105.4 kg)  03/02/21 236 lb 6.4 oz (107.2 kg)  01/17/21 234 lb (106.1 kg)     Health Maintenance Due  Topic Date Due   HIV Screening  Never done   Hepatitis C Screening  Never done   COVID-19 Vaccine (3 - Booster for Pfizer series) 08/30/2019   INFLUENZA VACCINE  10/30/2020    There are no preventive care reminders to display for this patient.  Lab Results  Component Value Date   TSH 2.64 01/17/2021   Lab Results  Component Value Date   WBC 7.7 01/17/2021   HGB 13.8 01/17/2021   HCT 41.5 01/17/2021   MCV 89.1 01/17/2021   PLT 184.0 01/17/2021   Lab Results   Component Value Date   NA 137 01/17/2021   K 4.0 01/17/2021   CO2 26 01/17/2021   GLUCOSE 86 01/17/2021  BUN 10 01/17/2021   CREATININE 0.69 01/17/2021   BILITOT 1.0 01/17/2021   ALKPHOS 55 01/17/2021   AST 20 01/17/2021   ALT 24 01/17/2021   PROT 7.0 01/17/2021   ALBUMIN 4.8 01/17/2021   CALCIUM 9.4 01/17/2021   ANIONGAP 9 02/07/2017   GFR 103.45 01/17/2021   Lab Results  Component Value Date   CHOL 149 01/17/2021   Lab Results  Component Value Date   HDL 46.20 01/17/2021   Lab Results  Component Value Date   LDLCALC 67 08/04/2019   Lab Results  Component Value Date   TRIG 220.0 (H) 01/17/2021   Lab Results  Component Value Date   CHOLHDL 3 01/17/2021   Lab Results  Component Value Date   HGBA1C 5.8 01/17/2021      Assessment & Plan:   Problem List Items Addressed This Visit   None Visit Diagnoses     Frequent urination    -  Primary   Relevant Orders   POCT urinalysis dipstick (Completed)   Other microscopic hematuria       Relevant Medications   sulfamethoxazole-trimethoprim (BACTRIM DS) 800-160 MG tablet   Other Relevant Orders   Urine Culture       Meds ordered this encounter  Medications   sulfamethoxazole-trimethoprim (BACTRIM DS) 800-160 MG tablet    Sig: Take 1 tablet by mouth 2 (two) times daily.    Dispense:  6 tablet    Refill:  0    Order Specific Question:   Supervising Provider    Answer:   Carlota Raspberry, JEFFREY R [[id number]]    Follow-up: No follow-ups on file.   PLAN Start bactrim po bid x 3 days for suspected UTI. If no improvement will discuss further plan If GI in etiology, can pursue aggressive hydration and BRAT diet.  Patient encouraged to call clinic with any questions, comments, or concerns.  Maximiano Coss, NP

## 2021-04-19 ENCOUNTER — Encounter: Payer: Self-pay | Admitting: Registered Nurse

## 2021-04-19 LAB — URINE CULTURE
MICRO NUMBER:: 12883996
SPECIMEN QUALITY:: ADEQUATE

## 2021-04-19 LAB — EXTRA URINE SPECIMEN

## 2021-07-24 ENCOUNTER — Other Ambulatory Visit: Payer: Self-pay | Admitting: Family Medicine

## 2021-09-11 ENCOUNTER — Telehealth: Payer: BC Managed Care – PPO | Admitting: Family Medicine

## 2021-09-11 DIAGNOSIS — B9689 Other specified bacterial agents as the cause of diseases classified elsewhere: Secondary | ICD-10-CM | POA: Diagnosis not present

## 2021-09-11 DIAGNOSIS — J019 Acute sinusitis, unspecified: Secondary | ICD-10-CM

## 2021-09-11 DIAGNOSIS — R051 Acute cough: Secondary | ICD-10-CM | POA: Diagnosis not present

## 2021-09-11 MED ORDER — AMOXICILLIN-POT CLAVULANATE 875-125 MG PO TABS: 1.0000 | ORAL_TABLET | Freq: Two times a day (BID) | ORAL | 0 refills | Status: AC

## 2021-09-11 MED ORDER — PROMETHAZINE-DM 6.25-15 MG/5ML PO SYRP: 5.0000 mL | ORAL_SOLUTION | Freq: Three times a day (TID) | ORAL | 0 refills | Status: DC | PRN

## 2021-09-11 NOTE — Progress Notes (Signed)
Virtual Visit Consent   Angie Martin, you are scheduled for a virtual visit with a [facility] provider today. Just as with appointments in the office, your consent must be obtained to participate. Your consent will be active for this visit and any virtual visit you may have with one of our providers in the next 365 days. If you have a MyChart account, a copy of this consent can be sent to you electronically.  As this is a virtual visit, video technology does not allow for your provider to perform a traditional examination. This may limit your provider's ability to fully assess your condition. If your provider identifies any concerns that need to be evaluated in person or the need to arrange testing (such as labs, EKG, etc.), we will make arrangements to do so. Although advances in technology are sophisticated, we cannot ensure that it will always work on either your end or our end. If the connection with a video visit is poor, the visit may have to be switched to a telephone visit. With either a video or telephone visit, we are not always able to ensure that we have a secure connection.  By engaging in this virtual visit, you consent to the provision of healthcare and authorize for your insurance to be billed (if applicable) for the services provided during this visit. Depending on your insurance coverage, you may receive a charge related to this service.  I need to obtain your verbal consent now. Are you willing to proceed with your visit today? Angie Martin has provided verbal consent on 09/11/2021 for a virtual visit (video or telephone). Perlie Mayo, NP  Date: 09/11/2021 8:46 AM  Virtual Visit via Video Note   I, Perlie Mayo, connected with  Angie Martin  ([id number], 08-Jul-1975) on 09/11/21 at  8:45 AM EDT by a video-enabled telemedicine application and verified that I am speaking with the correct person using two identifiers.  Location: Patient: Virtual Visit Location Patient:  Home Provider: Virtual Visit Location Provider: Home Office   I discussed the limitations of evaluation and management by telemedicine and the availability of in person appointments. The patient expressed understanding and agreed to proceed.    History of Present Illness: Angie Martin is a 46 y.o. who identifies as a female who was assigned female at birth, and is being seen today for cold like symptoms with cough. Started around 10 days ago and has been on going, is starting to feel better but her cough is on going and still feels run down. Reports nasal congestion- green mucus- increased a lot in the last week.Headaches and facial pain. Sore throat has improved. Denies fevers, chills, chest pain, shortness of breath.   Problems:  Patient Active Problem List   Diagnosis Date Noted   Physical exam 08/04/2019   Family history of early CAD 01/28/2019   Obesity (BMI 30-39.9) 01/28/2019   Anxiety and depression 01/28/2019   Benzodiazepine abuse in remission ([facility] [place]) 01/28/2019   Mild concussion 09/29/2017   Postoperative state 09/27/2015   Corneal abrasion, left 09/27/2015   Chest pain 06/28/2015   Dyslipidemia 06/28/2015   History of TIA (transient ischemic attack) 08/17/2014   Ocular migraine 08/17/2014   White matter abnormality on MRI of brain 08/17/2014   History of atrial septal defect repair 08/17/2014    Allergies:  Allergies  Allergen Reactions   Fentanyl Itching   Medications:  Current Outpatient Medications:    aspirin EC 81 MG tablet, Take 81 mg by mouth daily.  Swallow whole., Disp: , Rfl:    rosuvastatin (CRESTOR) 20 MG tablet, TAKE 1 TABLET BY MOUTH EVERY DAY, Disp: 90 tablet, Rfl: 1   sulfamethoxazole-trimethoprim (BACTRIM DS) 800-160 MG tablet, Take 1 tablet by mouth 2 (two) times daily., Disp: 6 tablet, Rfl: 0   Vitamin D, Ergocalciferol, (DRISDOL) 1.25 MG (50000 UNIT) CAPS capsule, Take 1 capsule (50,000 Units total) by mouth every 7 (seven) days., Disp: 12 capsule,  Rfl: 0  Observations/Objective: Patient is well-developed, well-nourished in no acute distress.  Resting comfortably  at home.  Head is normocephalic, atraumatic.  No labored breathing.  Speech is clear and coherent with logical content.  Patient is alert and oriented at baseline.  Wet cough noted  Nasal tone mild but present  Assessment and Plan: 1. Acute bacterial sinusitis  - amoxicillin-clavulanate (AUGMENTIN) 875-125 MG tablet; Take 1 tablet by mouth 2 (two) times daily for 7 days.  Dispense: 14 tablet; Refill: 0  2. Acute cough  - promethazine-dextromethorphan (PROMETHAZINE-DM) 6.25-15 MG/5ML syrup; Take 5 mLs by mouth 3 (three) times daily as needed for cough.  Dispense: 118 mL; Refill: 0  S&S consistent with viral turn bacterial sinus infection with lingering cough Treatment as per above in addition to OTC reviewed for symptom management Continue Claritin and Sudafed as needed  Will try prometh DM for cough- made aware note to double up on DM OTC    Reviewed side effects, risks and benefits of medication.    Patient acknowledged agreement and understanding of the plan.    Past Medical, Surgical, Social History, Allergies, and Medications have been Reviewed.     Follow Up Instructions: I discussed the assessment and treatment plan with the patient. The patient was provided an opportunity to ask questions and all were answered. The patient agreed with the plan and demonstrated an understanding of the instructions.  A copy of instructions were sent to the patient via MyChart unless otherwise noted below.   The patient was advised to call back or seek an in-person evaluation if the symptoms worsen or if the condition fails to improve as anticipated.  Time:  I spent 15 minutes with the patient via telehealth technology discussing the above problems/concerns.    Perlie Mayo, NP

## 2021-09-11 NOTE — Patient Instructions (Signed)

## 2022-01-20 ENCOUNTER — Other Ambulatory Visit: Payer: Self-pay | Admitting: Family Medicine

## 2022-03-14 DIAGNOSIS — L814 Other melanin hyperpigmentation: Secondary | ICD-10-CM | POA: Diagnosis not present

## 2022-03-14 DIAGNOSIS — L821 Other seborrheic keratosis: Secondary | ICD-10-CM | POA: Diagnosis not present

## 2022-03-14 DIAGNOSIS — D225 Melanocytic nevi of trunk: Secondary | ICD-10-CM | POA: Diagnosis not present

## 2022-03-14 DIAGNOSIS — L570 Actinic keratosis: Secondary | ICD-10-CM | POA: Diagnosis not present

## 2022-03-14 DIAGNOSIS — Z08 Encounter for follow-up examination after completed treatment for malignant neoplasm: Secondary | ICD-10-CM | POA: Diagnosis not present

## 2022-03-20 DIAGNOSIS — Z01419 Encounter for gynecological examination (general) (routine) without abnormal findings: Secondary | ICD-10-CM | POA: Diagnosis not present

## 2022-03-20 DIAGNOSIS — Z1231 Encounter for screening mammogram for malignant neoplasm of breast: Secondary | ICD-10-CM | POA: Diagnosis not present

## 2022-04-17 ENCOUNTER — Encounter: Payer: Self-pay | Admitting: Family Medicine

## 2022-04-17 ENCOUNTER — Ambulatory Visit (INDEPENDENT_AMBULATORY_CARE_PROVIDER_SITE_OTHER): Payer: BC Managed Care – PPO | Admitting: Family Medicine

## 2022-04-17 VITALS — BP 118/80 | HR 65 | Temp 97.3°F | Ht 70.0 in | Wt 216.0 lb

## 2022-04-17 DIAGNOSIS — Z1159 Encounter for screening for other viral diseases: Secondary | ICD-10-CM | POA: Diagnosis not present

## 2022-04-17 DIAGNOSIS — Z0000 Encounter for general adult medical examination without abnormal findings: Secondary | ICD-10-CM

## 2022-04-17 DIAGNOSIS — Z114 Encounter for screening for human immunodeficiency virus [HIV]: Secondary | ICD-10-CM

## 2022-04-17 DIAGNOSIS — E785 Hyperlipidemia, unspecified: Secondary | ICD-10-CM | POA: Diagnosis not present

## 2022-04-17 LAB — LIPID PANEL
Cholesterol: 134 mg/dL (ref 0–200)
HDL: 49.2 mg/dL (ref >39.00)
LDL Cholesterol: 62 mg/dL (ref 0–99)
NonHDL: 85.12
Total CHOL/HDL Ratio: 3
Triglycerides: 114 mg/dL (ref 0.0–149.0)
VLDL: 22.8 mg/dL (ref 0.0–40.0)

## 2022-04-17 LAB — CBC WITH DIFFERENTIAL/PLATELET
Basophils Absolute: 0 10*3/uL (ref 0.0–0.1)
Basophils Relative: 0.6 % (ref 0.0–3.0)
Eosinophils Absolute: 0.1 10*3/uL (ref 0.0–0.7)
Eosinophils Relative: 1.7 % (ref 0.0–5.0)
HCT: 41.5 % (ref 36.0–46.0)
Hemoglobin: 13.9 g/dL (ref 12.0–15.0)
Lymphocytes Relative: 30.5 % (ref 12.0–46.0)
Lymphs Abs: 1.6 10*3/uL (ref 0.7–4.0)
MCHC: 33.5 g/dL (ref 30.0–36.0)
MCV: 91.2 fl (ref 78.0–100.0)
Monocytes Absolute: 0.4 10*3/uL (ref 0.1–1.0)
Monocytes Relative: 7.1 % (ref 3.0–12.0)
Neutro Abs: 3.2 10*3/uL (ref 1.4–7.7)
Neutrophils Relative %: 60.1 % (ref 43.0–77.0)
Platelets: 193 10*3/uL (ref 150.0–400.0)
RBC: 4.56 Mil/uL (ref 3.87–5.11)
RDW: 12.9 % (ref 11.5–15.5)
WBC: 5.4 10*3/uL (ref 4.0–10.5)

## 2022-04-17 LAB — HEPATIC FUNCTION PANEL
ALT: 17 U/L (ref 0–35)
AST: 14 U/L (ref 0–37)
Albumin: 4.7 g/dL (ref 3.5–5.2)
Alkaline Phosphatase: 44 U/L (ref 39–117)
Bilirubin, Direct: 0.3 mg/dL (ref 0.0–0.3)
Total Bilirubin: 1.5 mg/dL — ABNORMAL HIGH (ref 0.2–1.2)
Total Protein: 7.3 g/dL (ref 6.0–8.3)

## 2022-04-17 LAB — BASIC METABOLIC PANEL
BUN: 15 mg/dL (ref 6–23)
CO2: 27 mEq/L (ref 19–32)
Calcium: 9.1 mg/dL (ref 8.4–10.5)
Chloride: 102 mEq/L (ref 96–112)
Creatinine, Ser: 0.59 mg/dL (ref 0.40–1.20)
GFR: 106.49 mL/min (ref >60.00)
Glucose, Bld: 98 mg/dL (ref 70–99)
Potassium: 4 mEq/L (ref 3.5–5.1)
Sodium: 136 mEq/L (ref 135–145)

## 2022-04-17 LAB — TSH: TSH: 2.3 u[IU]/mL (ref 0.35–5.50)

## 2022-04-17 NOTE — Assessment & Plan Note (Signed)
Chronic problem.  Check labs.  Adjust meds prn  

## 2022-04-17 NOTE — Progress Notes (Signed)
   Subjective:    Patient ID: Angie Martin, female    DOB: 02-26-1976, 47 y.o.   MRN: [id number]  HPI CPE- UTD on pap, mammo, colonoscopy, Tdap, flu.  Patient Care Team    Relationship Specialty Notifications Start End  Midge Minium, MD PCP - General Family Medicine  01/28/19   Bobbye Charleston, MD Consulting Physician Obstetrics and Gynecology  01/28/19      Health Maintenance  Topic Date Due   HIV Screening  Never done   Hepatitis C Screening  Never done   COVID-19 Vaccine (3 - 2023-24 season) 05/03/2022 (Originally 11/30/2021)   PAP SMEAR-Modifier  11/02/2022   DTaP/Tdap/Td (2 - Td or Tdap) 01/27/2029   COLONOSCOPY (Pts 45-34yr Insurance coverage will need to be confirmed)  01/23/2031   INFLUENZA VACCINE  Completed   HPV VACCINES  Aged Out      Review of Systems Patient reports no vision/ hearing changes, adenopathy,fever, swallowing issues, chest pain, palpitations, edema, persistant/recurrent cough, hemoptysis, dyspnea (rest/exertional/paroxysmal nocturnal), gastrointestinal bleeding (melena, rectal bleeding), abdominal pain, significant heartburn, bowel changes, GU symptoms (dysuria, hematuria, incontinence), Gyn symptoms (abnormal  bleeding, pain),  syncope, focal weakness, memory loss, numbness & tingling, skin/hair/nail changes, abnormal bruising or bleeding, anxiety, or depression.   + hoarseness intermittently x6-8 months  + 16 lb weight loss    Objective:   Physical Exam General Appearance:    Alert, cooperative, no distress, appears stated age  Head:    Normocephalic, without obvious abnormality, atraumatic  Eyes:    PERRL, conjunctiva/corneas clear, EOM's intact, both eyes  Ears:    Normal TM's and external ear canals, both ears  Nose:   Nares normal, septum midline, mucosa normal, no drainage    or sinus tenderness  Throat:   Lips, mucosa, and tongue normal; teeth and gums normal  Neck:   Supple, symmetrical, trachea midline, no adenopathy;    Thyroid:  no enlargement/tenderness/nodules  Back:     Symmetric, no curvature, ROM normal, no CVA tenderness  Lungs:     Clear to auscultation bilaterally, respirations unlabored  Chest Wall:    No tenderness or deformity   Heart:    Regular rate and rhythm, S1 and S2 normal, no murmur, rub   or gallop  Breast Exam:    Deferred to GYN  Abdomen:     Soft, non-tender, bowel sounds active all four quadrants,    no masses, no organomegaly  Genitalia:    Deferred to GYN  Rectal:    Extremities:   Extremities normal, atraumatic, no cyanosis or edema  Pulses:   2+ and symmetric all extremities  Skin:   Skin color, texture, turgor normal, no rashes or lesions  Lymph nodes:   Cervical, supraclavicular, and axillary nodes normal  Neurologic:   CNII-XII intact, normal strength, sensation and reflexes    throughout          Assessment & Plan:

## 2022-04-17 NOTE — Assessment & Plan Note (Signed)
Pt's PE WNL.  UTD on pap, mammo, colonoscopy, immunizations.  Check labs.  Anticipatory guidance provided.  

## 2022-04-17 NOTE — Patient Instructions (Signed)
Follow up in 1 year or as needed We'll notify you of your lab results and make any changes if needed Continue to work on healthy diet and regular exercise- you're doing great! Call with any questions or concerns Stay Safe!  Stay Healthy! Happy New Year!!

## 2022-04-18 ENCOUNTER — Telehealth: Payer: Self-pay

## 2022-04-18 LAB — HIV ANTIBODY (ROUTINE TESTING W REFLEX): HIV 1&2 Ab, 4th Generation: NONREACTIVE

## 2022-04-18 LAB — HEPATITIS C ANTIBODY: Hepatitis C Ab: NONREACTIVE

## 2022-04-18 NOTE — Telephone Encounter (Signed)
-----  Message from Midge Minium, MD sent at 04/18/2022  7:32 AM EST ----- Labs look great!  No changes at this time

## 2022-04-18 NOTE — Telephone Encounter (Signed)
Informed pt of lab results  

## 2022-04-19 DIAGNOSIS — M79671 Pain in right foot: Secondary | ICD-10-CM | POA: Diagnosis not present

## 2022-07-18 ENCOUNTER — Other Ambulatory Visit: Payer: Self-pay | Admitting: Family Medicine

## 2022-08-09 ENCOUNTER — Other Ambulatory Visit (HOSPITAL_COMMUNITY): Payer: Self-pay

## 2022-09-05 DIAGNOSIS — F3340 Major depressive disorder, recurrent, in remission, unspecified: Secondary | ICD-10-CM | POA: Diagnosis not present

## 2022-09-11 DIAGNOSIS — F3340 Major depressive disorder, recurrent, in remission, unspecified: Secondary | ICD-10-CM | POA: Diagnosis not present

## 2022-09-12 DIAGNOSIS — F3340 Major depressive disorder, recurrent, in remission, unspecified: Secondary | ICD-10-CM | POA: Diagnosis not present

## 2022-09-17 DIAGNOSIS — Z789 Other specified health status: Secondary | ICD-10-CM | POA: Diagnosis not present

## 2022-09-17 DIAGNOSIS — Z08 Encounter for follow-up examination after completed treatment for malignant neoplasm: Secondary | ICD-10-CM | POA: Diagnosis not present

## 2022-09-17 DIAGNOSIS — L820 Inflamed seborrheic keratosis: Secondary | ICD-10-CM | POA: Diagnosis not present

## 2022-09-17 DIAGNOSIS — L538 Other specified erythematous conditions: Secondary | ICD-10-CM | POA: Diagnosis not present

## 2022-09-17 DIAGNOSIS — L814 Other melanin hyperpigmentation: Secondary | ICD-10-CM | POA: Diagnosis not present

## 2022-09-17 DIAGNOSIS — Z85828 Personal history of other malignant neoplasm of skin: Secondary | ICD-10-CM | POA: Diagnosis not present

## 2022-09-17 DIAGNOSIS — R208 Other disturbances of skin sensation: Secondary | ICD-10-CM | POA: Diagnosis not present

## 2022-09-20 DIAGNOSIS — F3340 Major depressive disorder, recurrent, in remission, unspecified: Secondary | ICD-10-CM | POA: Diagnosis not present

## 2022-09-26 DIAGNOSIS — F3340 Major depressive disorder, recurrent, in remission, unspecified: Secondary | ICD-10-CM | POA: Diagnosis not present

## 2022-10-10 DIAGNOSIS — F3340 Major depressive disorder, recurrent, in remission, unspecified: Secondary | ICD-10-CM | POA: Diagnosis not present

## 2022-10-17 DIAGNOSIS — F3340 Major depressive disorder, recurrent, in remission, unspecified: Secondary | ICD-10-CM | POA: Diagnosis not present

## 2022-10-24 DIAGNOSIS — F3340 Major depressive disorder, recurrent, in remission, unspecified: Secondary | ICD-10-CM | POA: Diagnosis not present

## 2022-10-31 DIAGNOSIS — F3340 Major depressive disorder, recurrent, in remission, unspecified: Secondary | ICD-10-CM | POA: Diagnosis not present

## 2022-12-25 DIAGNOSIS — M545 Low back pain, unspecified: Secondary | ICD-10-CM | POA: Diagnosis not present

## 2023-01-08 ENCOUNTER — Other Ambulatory Visit: Payer: Self-pay | Admitting: Family Medicine

## 2023-03-19 ENCOUNTER — Ambulatory Visit: Payer: BC Managed Care – PPO | Admitting: Family Medicine

## 2023-03-19 VITALS — BP 124/76 | HR 81 | Temp 98.8°F | Ht 70.0 in | Wt 219.0 lb

## 2023-03-19 DIAGNOSIS — R1031 Right lower quadrant pain: Secondary | ICD-10-CM | POA: Diagnosis not present

## 2023-03-19 DIAGNOSIS — M545 Low back pain, unspecified: Secondary | ICD-10-CM | POA: Diagnosis not present

## 2023-03-19 LAB — POCT URINALYSIS DIP (MANUAL ENTRY)
Bilirubin, UA: NEGATIVE
Blood, UA: NEGATIVE
Glucose, UA: NEGATIVE mg/dL
Ketones, POC UA: NEGATIVE mg/dL
Leukocytes, UA: NEGATIVE
Nitrite, UA: NEGATIVE
Protein Ur, POC: NEGATIVE mg/dL
Spec Grav, UA: 1.015 (ref 1.010–1.025)
Urobilinogen, UA: 0.2 U/dL
pH, UA: 6.5 (ref 5.0–8.0)

## 2023-03-19 NOTE — Patient Instructions (Signed)
Please have labs at the [facility] location tomorrow if possible.  I have placed an order for the CT abdomen/pelvis, they should be contacting you soon for that appointment.  Will let you know if any concerns on labs and once I receive the reading on that CT scan. Return to the clinic or go to the nearest emergency room if any of your symptoms worsen or new symptoms occur. Thanks for coming in today and please let us know if there are questions in the meantime.  [facility] or xray: Walk in 8:30-4:30 during weekdays, no appointment needed [address]  [place], [place] [address]   Abdominal Pain, Adult  Pain in the abdomen (abdominal pain) can be caused by many things. In most cases, it gets better with no treatment or by being treated at home. But in some cases, it can be serious. Your health care provider will ask questions about your medical history and do a physical exam to try to figure out what is causing your pain. Follow these instructions at home: Medicines Take over-the-counter and prescription medicines only as told by your provider. Do not take medicines that help you poop (laxatives) unless told by your provider. General instructions Watch your condition for any changes. Drink enough fluid to keep your pee (urine) pale yellow. Contact a health care provider if: Your pain changes, gets worse, or lasts longer than expected. You have severe cramping or bloating in your abdomen, or you vomit. Your pain gets worse with meals, after eating, or with certain foods. You are constipated or have diarrhea for more than 2-3 days. You are not hungry, or you lose weight without trying. You have signs of dehydration. These may include: Dark pee, very little pee, or no pee. Cracked lips or dry mouth. Sleepiness or weakness. You have pain when you pee (urinate) or poop. Your abdominal pain wakes you up at night. You have blood in your pee. You have a fever. Get help right away  if: You cannot stop vomiting. Your pain is only in one part of the abdomen. Pain on the right side could be caused by appendicitis. You have bloody or black poop (stool), or poop that looks like tar. You have trouble breathing. You have chest pain. These symptoms may be an emergency. Get help right away. Call 911. Do not wait to see if the symptoms will go away. Do not drive yourself to the hospital. This information is not intended to replace advice given to you by your health care provider. Make sure you discuss any questions you have with your health care provider. Document Revised: 01/02/2022 Document Reviewed: 01/02/2022 Elsevier Patient Education  2024 [employer].

## 2023-03-19 NOTE — Progress Notes (Signed)
Subjective:  Patient ID: Angie Martin, female    DOB: 07-01-1975  Age: 47 y.o. MRN: [id number]  CC:  Chief Complaint  Patient presents with   Abdominal Pain    Pt notes Rt sideed abdominal pain intermitent and has had a flare about a week ago and notes it is more severe, notes a "pulling sensation" has tried doing urinary supplements to see if it helps and no relief with that, notes worst in the morning, pt notes pain does move down her side     HPI Angie Martin presents for  Acute visit for above.  PCP Dr. Beverely Low.  Abdominal pain R sided pain.  R lower abdomen.  Noticed more acutely last 7-10 days.  No fever/n/v. Intermittent nausea.  No stool changes, but has been experiencing some bloating. No dysuria/hematuria. No change in frequency or new urgency.  Alleve every other day for back pain.   Hx of L5 problems in back. Has been uncomfortable for about 6 months. Has been undergoing PT for back - initially R leg radiation that improved. No MRI, but had xrays. Pain out of leg and foot after PT. Some persistent back discomfort. R side only.   Elevated bilirubin 1.5 in January, other LFT's normal.  Possible yellowing of the eyes 6-7 months ago - sun damage? No changes and no jaundice.  No concerns when seen by eye specialist for other concerns.   Tx: cranberry pills,  inflammatory juice cleanse  - no change.  Alleve as above for back pain.  History Patient Active Problem List   Diagnosis Date Noted   Physical exam 08/04/2019   Family history of early CAD 01/28/2019   Obesity (BMI 30-39.9) 01/28/2019   Anxiety and depression 01/28/2019   Benzodiazepine abuse in remission ([facility]) 01/28/2019   Mild concussion 09/29/2017   Postoperative state 09/27/2015   Corneal abrasion, left 09/27/2015   Chest pain 06/28/2015   Dyslipidemia 06/28/2015   History of TIA (transient ischemic attack) 08/17/2014   Ocular migraine 08/17/2014   White matter abnormality on MRI of brain 08/17/2014    History of atrial septal defect repair 08/17/2014   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   ASD (atrial septal defect)    repaired in 2010 at [facility] - 30mm Helix ASD Closure   Depression    no meds - "eat pot cookies"   Fibroids    High cholesterol    diet controlled, no meds   Ocular migraine    otc meds prn   TIA (transient ischemic attack) 06/2008   mild- surgery for ASD closure   Past Surgical History:  Procedure Laterality Date   ASD REPAIR     bone spur removal Left 2001   CYSTOSCOPY  09/27/2015   Procedure: CYSTOSCOPY;  Surgeon: Carrington Clamp, MD;  Location: [facility];  Service: Gynecology;;   Helix device  2010   at [facility] - 30 mm Helix ASD Closure   ROBOTIC ASSISTED TOTAL HYSTERECTOMY N/A 09/27/2015   Procedure: ROBOTIC ASSISTED TOTAL HYSTERECTOMY, Bilateral Salpingectomy;  Surgeon: Carrington Clamp, MD;  Location: [facility];  Service: Gynecology;  Laterality: N/A;   TONSILLECTOMY  1993   vein laser surgery  2016   bilateral legs   WISDOM TOOTH EXTRACTION     Allergies  Allergen Reactions   Fentanyl Itching   Prior to Admission medications   Medication Sig Start Date End Date Taking? Authorizing Provider  aspirin EC 81 MG tablet Take 81 mg by mouth daily. Swallow whole.   Yes [provider]  rosuvastatin (CRESTOR) 20 MG tablet TAKE 1 TABLET BY MOUTH EVERY DAY 01/08/23  Yes Sheliah Hatch, MD  Vitamin D, Ergocalciferol, (DRISDOL) 1.25 MG (50000 UNIT) CAPS capsule Take 1 capsule (50,000 Units total) by mouth every 7 (seven) days. 01/19/21  Yes Sheliah Hatch, MD   Social History   Socioeconomic History   Marital status: Domestic Partner    Spouse name: Not on file   Number of children: 0   Years of education: college   Highest education level: Bachelor's degree (e.g., BA, AB, BS)  Occupational History   Occupation: Education officer, environmental: OTHER    Comment: [employer]  Tobacco Use   Smoking status: Never    Passive exposure: Never   Smokeless tobacco: Never  Substance and Sexual Activity   Alcohol use: Yes    Alcohol/week: 11.0 standard drinks of alcohol    Types: 9 Shots of liquor, 2 Standard drinks or equivalent per week   Drug use: Yes    Types: Marijuana    Comment: "eats pot cookies" last used 6/11/7   Sexual activity: Yes    Partners: Female    Birth control/protection: None    Comment: Partner- Research scientist (medical)  Other Topics Concern   Not on file  Social History Narrative   Partner- Research scientist (medical).  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  No children.      epworth sleepiness scale = 4 (06/28/2015)   Social Drivers of Health   Financial Resource Strain: Low Risk  (03/19/2023)   Overall Financial Resource Strain (CARDIA)    Difficulty of Paying Living Expenses: Not hard at all  Food Insecurity: No Food Insecurity (03/19/2023)   Hunger Vital Sign    Worried About Running Out of Food in the Last Year: Never true    Ran Out of Food in the Last Year: Never true  Transportation Needs: No Transportation Needs (03/19/2023)   PRAPARE - Administrator, Civil Service (Medical): No    Lack of Transportation (Non-Medical): No  Physical Activity: Unknown (03/19/2023)   Exercise Vital Sign    Days of Exercise per Week: 0 days    Minutes of Exercise per Session: Not on file  Stress: No Stress Concern Present (03/19/2023)   [employer] of Occupational Health - Occupational Stress Questionnaire    Feeling of Stress : Not at all  Social Connections: Socially Isolated (03/19/2023)   Social Connection and Isolation Panel [NHANES]    Frequency of Communication with Friends and Family: Once a week    Frequency of Social Gatherings with Friends and Family: Once a week    Attends Religious Services: Never    Database administrator or Organizations: No    Attends Engineer, structural: Not on file    Marital Status: Living with partner   Intimate Partner Violence: Not on file    Review of Systems Per HPI  Objective:   Vitals:   03/19/23 1643  BP: 124/76  Pulse: 81  Temp: 98.8 F (37.1 C)  TempSrc: Temporal  SpO2: 99%  Weight: 219 lb (99.3 kg)  Height: 5\' 10"  (1.778 m)     Physical Exam Vitals reviewed.  Constitutional:      Appearance: Normal appearance. She is well-developed.  HENT:     Head: Normocephalic and atraumatic.  Eyes:     General: No  scleral icterus.    Conjunctiva/sclera: Conjunctivae normal.     Pupils: Pupils are equal, round, and reactive to light.  Neck:     Vascular: No carotid bruit.  Cardiovascular:     Rate and Rhythm: Normal rate and regular rhythm.     Heart sounds: Normal heart sounds.  Pulmonary:     Effort: Pulmonary effort is normal.     Breath sounds: Normal breath sounds.  Abdominal:     Palpations: Abdomen is soft. There is no pulsatile mass.     Tenderness: There is abdominal tenderness in the right lower quadrant. There is no right CVA tenderness, left CVA tenderness, guarding or rebound. Positive signs include McBurney's sign. Negative signs include Murphy's sign.  Musculoskeletal:     Right lower leg: No edema.     Left lower leg: No edema.  Skin:    General: Skin is warm and dry.     Coloration: Skin is not jaundiced.  Neurological:     Mental Status: She is alert and oriented to person, place, and time.  Psychiatric:        Mood and Affect: Mood normal.        Behavior: Behavior normal.    Results for orders placed or performed in visit on 03/19/23  POCT urinalysis dipstick   Collection Time: 03/19/23  5:33 PM  Result Value Ref Range   Color, UA yellow yellow   Clarity, UA clear clear   Glucose, UA negative negative mg/dL   Bilirubin, UA negative negative   Ketones, POC UA negative negative mg/dL   Spec Grav, UA 1.610 9.604 - 1.025   Blood, UA negative negative   pH, UA 6.5 5.0 - 8.0   Protein Ur, POC negative negative mg/dL   Urobilinogen, UA  0.2 0.2 or 1.0 E.U./dL   Nitrite, UA Negative Negative   Leukocytes, UA Negative Negative      Assessment & Plan:  Melvis Pedley is a 47 y.o. female . RLQ abdominal pain - Plan: CBC, Comprehensive metabolic panel, POCT urinalysis dipstick  Right low back pain, unspecified chronicity, unspecified whether sciatica present  Right lower quadrant abdominal pain - Plan: CT ABDOMEN PELVIS W CONTRAST  7 to 10-day history of right lower quadrant abdominal pain, bloating, intermittent nausea.  No vomiting, no stool changes.  No urinary symptoms and in office urinalysis reassuring.  Some chronic right-sided low back pain with prior radicular symptoms but those have improved with physical therapy.  No change with home treatments.  Afebrile in office but she is tender over McBurney's point, right lower quadrant.  No rebound or guarding at present.  -Check CBC, CMP with prior elevated bilirubin.  Unlikely gallbladder/liver cause given no right upper quadrant discomfort.  I do not appreciate any scleral icterus or jaundice on exam.  -CT abdomen pelvis ordered.  ER/RTC precautions.  Further workup depending on labs and imaging above.  No orders of the defined types were placed in this encounter.  Patient Instructions  Please have labs at the [facility] location tomorrow if possible.  I have placed an order for the CT abdomen/pelvis, they should be contacting you soon for that appointment.  Will let you know if any concerns on labs and once I receive the reading on that CT scan. Return to the clinic or go to the nearest emergency room if any of your symptoms worsen or new symptoms occur. Thanks for coming in today and please let us know if there are questions in the  meantime.  [facility] or xray: Walk in 8:30-4:30 during weekdays, no appointment needed [address]  [place], [place] [address]   Abdominal Pain, Adult  Pain in the abdomen (abdominal pain) can be caused by many things. In most  cases, it gets better with no treatment or by being treated at home. But in some cases, it can be serious. Your health care provider will ask questions about your medical history and do a physical exam to try to figure out what is causing your pain. Follow these instructions at home: Medicines Take over-the-counter and prescription medicines only as told by your provider. Do not take medicines that help you poop (laxatives) unless told by your provider. General instructions Watch your condition for any changes. Drink enough fluid to keep your pee (urine) pale yellow. Contact a health care provider if: Your pain changes, gets worse, or lasts longer than expected. You have severe cramping or bloating in your abdomen, or you vomit. Your pain gets worse with meals, after eating, or with certain foods. You are constipated or have diarrhea for more than 2-3 days. You are not hungry, or you lose weight without trying. You have signs of dehydration. These may include: Dark pee, very little pee, or no pee. Cracked lips or dry mouth. Sleepiness or weakness. You have pain when you pee (urinate) or poop. Your abdominal pain wakes you up at night. You have blood in your pee. You have a fever. Get help right away if: You cannot stop vomiting. Your pain is only in one part of the abdomen. Pain on the right side could be caused by appendicitis. You have bloody or black poop (stool), or poop that looks like tar. You have trouble breathing. You have chest pain. These symptoms may be an emergency. Get help right away. Call 911. Do not wait to see if the symptoms will go away. Do not drive yourself to the hospital. This information is not intended to replace advice given to you by your health care provider. Make sure you discuss any questions you have with your health care provider. Document Revised: 01/02/2022 Document Reviewed: 01/02/2022 Elsevier Patient Education  2024 Elsevier  Inc.     Signed,   Meredith Staggers, MD [facility], [facility] 03/19/23 5:26 PM

## 2023-03-20 ENCOUNTER — Other Ambulatory Visit (INDEPENDENT_AMBULATORY_CARE_PROVIDER_SITE_OTHER): Payer: BC Managed Care – PPO

## 2023-03-20 ENCOUNTER — Ambulatory Visit
Admission: RE | Admit: 2023-03-20 | Discharge: 2023-03-20 | Disposition: A | Discharge status: Home / Self Care | Payer: BC Managed Care – PPO | Source: Ambulatory Visit | Attending: Family Medicine | Admitting: Family Medicine

## 2023-03-20 DIAGNOSIS — R1031 Right lower quadrant pain: Secondary | ICD-10-CM

## 2023-03-20 LAB — COMPREHENSIVE METABOLIC PANEL
ALT: 27 U/L (ref 0–35)
AST: 28 U/L (ref 0–37)
Albumin: 4.7 g/dL (ref 3.5–5.2)
Alkaline Phosphatase: 51 U/L (ref 39–117)
BUN: 14 mg/dL (ref 6–23)
CO2: 25 meq/L (ref 19–32)
Calcium: 9.3 mg/dL (ref 8.4–10.5)
Chloride: 104 meq/L (ref 96–112)
Creatinine, Ser: 0.73 mg/dL (ref 0.40–1.20)
GFR: 96.55 mL/min (ref >60.00)
Glucose, Bld: 97 mg/dL (ref 70–99)
Potassium: 4.3 meq/L (ref 3.5–5.1)
Sodium: 138 meq/L (ref 135–145)
Total Bilirubin: 1.1 mg/dL (ref 0.2–1.2)
Total Protein: 7.1 g/dL (ref 6.0–8.3)

## 2023-03-20 LAB — CBC
HCT: 43.1 % (ref 36.0–46.0)
Hemoglobin: 14.4 g/dL (ref 12.0–15.0)
MCHC: 33.5 g/dL (ref 30.0–36.0)
MCV: 91.9 fL (ref 78.0–100.0)
Platelets: 228 10*3/uL (ref 150.0–400.0)
RBC: 4.69 Mil/uL (ref 3.87–5.11)
RDW: 13.1 % (ref 11.5–15.5)
WBC: 7.4 10*3/uL (ref 4.0–10.5)

## 2023-03-20 MED ORDER — IOPAMIDOL (ISOVUE-300) INJECTION 61%
100.0000 mL | Freq: Once | INTRAVENOUS | Status: AC | PRN
  Administered 2023-03-20: 100 mL via INTRAVENOUS

## 2023-04-18 DIAGNOSIS — F419 Anxiety disorder, unspecified: Secondary | ICD-10-CM | POA: Insufficient documentation

## 2023-04-18 DIAGNOSIS — A09 Infectious gastroenteritis and colitis, unspecified: Secondary | ICD-10-CM | POA: Insufficient documentation

## 2023-04-18 DIAGNOSIS — E785 Hyperlipidemia, unspecified: Secondary | ICD-10-CM | POA: Insufficient documentation

## 2023-04-18 DIAGNOSIS — Q211 Atrial septal defect, unspecified: Secondary | ICD-10-CM | POA: Insufficient documentation

## 2023-04-21 ENCOUNTER — Encounter: Payer: Self-pay | Admitting: Family Medicine

## 2023-04-21 ENCOUNTER — Ambulatory Visit (INDEPENDENT_AMBULATORY_CARE_PROVIDER_SITE_OTHER): Payer: BC Managed Care – PPO | Admitting: Family Medicine

## 2023-04-21 VITALS — BP 112/78 | HR 71 | Temp 97.8°F | Ht 70.0 in | Wt 222.4 lb

## 2023-04-21 DIAGNOSIS — E559 Vitamin D deficiency, unspecified: Secondary | ICD-10-CM

## 2023-04-21 DIAGNOSIS — Z1231 Encounter for screening mammogram for malignant neoplasm of breast: Secondary | ICD-10-CM | POA: Diagnosis not present

## 2023-04-21 DIAGNOSIS — Z01419 Encounter for gynecological examination (general) (routine) without abnormal findings: Secondary | ICD-10-CM | POA: Diagnosis not present

## 2023-04-21 DIAGNOSIS — E785 Hyperlipidemia, unspecified: Secondary | ICD-10-CM

## 2023-04-21 DIAGNOSIS — Z0000 Encounter for general adult medical examination without abnormal findings: Secondary | ICD-10-CM | POA: Diagnosis not present

## 2023-04-21 LAB — HEPATIC FUNCTION PANEL
ALT: 21 U/L (ref 0–35)
AST: 15 U/L (ref 0–37)
Albumin: 4.7 g/dL (ref 3.5–5.2)
Alkaline Phosphatase: 56 U/L (ref 39–117)
Bilirubin, Direct: 0.2 mg/dL (ref 0.0–0.3)
Total Bilirubin: 1.4 mg/dL — ABNORMAL HIGH (ref 0.2–1.2)
Total Protein: 6.7 g/dL (ref 6.0–8.3)

## 2023-04-21 LAB — BASIC METABOLIC PANEL
BUN: 11 mg/dL (ref 6–23)
CO2: 22 meq/L (ref 19–32)
Calcium: 8.9 mg/dL (ref 8.4–10.5)
Chloride: 104 meq/L (ref 96–112)
Creatinine, Ser: 0.55 mg/dL (ref 0.40–1.20)
GFR: 107.54 mL/min (ref >60.00)
Glucose, Bld: 95 mg/dL (ref 70–99)
Potassium: 4.3 meq/L (ref 3.5–5.1)
Sodium: 136 meq/L (ref 135–145)

## 2023-04-21 LAB — CBC WITH DIFFERENTIAL/PLATELET
Basophils Absolute: 0 10*3/uL (ref 0.0–0.1)
Basophils Relative: 0.6 % (ref 0.0–3.0)
Eosinophils Absolute: 0.1 10*3/uL (ref 0.0–0.7)
Eosinophils Relative: 0.9 % (ref 0.0–5.0)
HCT: 40.4 % (ref 36.0–46.0)
Hemoglobin: 13.3 g/dL (ref 12.0–15.0)
Lymphocytes Relative: 27.5 % (ref 12.0–46.0)
Lymphs Abs: 1.8 10*3/uL (ref 0.7–4.0)
MCHC: 33 g/dL (ref 30.0–36.0)
MCV: 91.8 fL (ref 78.0–100.0)
Monocytes Absolute: 0.5 10*3/uL (ref 0.1–1.0)
Monocytes Relative: 7.1 % (ref 3.0–12.0)
Neutro Abs: 4.2 10*3/uL (ref 1.4–7.7)
Neutrophils Relative %: 63.9 % (ref 43.0–77.0)
Platelets: 196 10*3/uL (ref 150.0–400.0)
RBC: 4.4 Mil/uL (ref 3.87–5.11)
RDW: 12.8 % (ref 11.5–15.5)
WBC: 6.6 10*3/uL (ref 4.0–10.5)

## 2023-04-21 LAB — LIPID PANEL
Cholesterol: 157 mg/dL (ref 0–200)
HDL: 47.3 mg/dL (ref >39.00)
LDL Cholesterol: 79 mg/dL (ref 0–99)
NonHDL: 109.47
Total CHOL/HDL Ratio: 3
Triglycerides: 152 mg/dL — ABNORMAL HIGH (ref 0.0–149.0)
VLDL: 30.4 mg/dL (ref 0.0–40.0)

## 2023-04-21 LAB — TSH: TSH: 2.61 u[IU]/mL (ref 0.35–5.50)

## 2023-04-21 LAB — HM MAMMOGRAPHY

## 2023-04-21 LAB — VITAMIN D 25 HYDROXY (VIT D DEFICIENCY, FRACTURES): VITD: 30.39 ng/mL (ref 30.00–100.00)

## 2023-04-21 NOTE — Progress Notes (Signed)
   Subjective:    Patient ID: Angie Martin, female    DOB: 1975/06/22, 48 y.o.   MRN: [id number]  HPI CPE- UTD on Tdap, colonoscopy, flu.  Mammo scheduled.  Patient Care Team    Relationship Specialty Notifications Start End  Sheliah Hatch, MD PCP - General Family Medicine  01/28/19   Carrington Clamp, MD Consulting Physician Obstetrics and Gynecology  01/28/19     Health Maintenance  Topic Date Due   COVID-19 Vaccine (3 - 2024-25 season) 12/01/2022   DTaP/Tdap/Td (4 - Td or Tdap) 01/27/2029   Colonoscopy  01/23/2031   INFLUENZA VACCINE  Completed   Hepatitis C Screening  Completed   HIV Screening  Completed   HPV VACCINES  Aged Out      Review of Systems Patient reports no vision/ hearing changes, adenopathy,fever, weight change,  persistant/recurrent hoarseness , swallowing issues, chest pain, palpitations, edema, persistant/recurrent cough, hemoptysis, dyspnea (rest/exertional/paroxysmal nocturnal), gastrointestinal bleeding (melena, rectal bleeding), abdominal pain, significant heartburn, bowel changes, GU symptoms (dysuria, hematuria, incontinence), Gyn symptoms (abnormal  bleeding, pain),  syncope, focal weakness, memory loss, numbness & tingling, skin/hair/nail changes, abnormal bruising or bleeding, anxiety, or depression.     Objective:   Physical Exam General Appearance:    Alert, cooperative, no distress, appears stated age, obese  Head:    Normocephalic, without obvious abnormality, atraumatic  Eyes:    PERRL, conjunctiva/corneas clear, EOM's intact both eyes  Ears:    Normal TM's and external ear canals, both ears  Nose:   Nares normal, septum midline, mucosa normal, no drainage    or sinus tenderness  Throat:   Lips, mucosa, and tongue normal; teeth and gums normal  Neck:   Supple, symmetrical, trachea midline, no adenopathy;    Thyroid: no enlargement/tenderness/nodules  Back:     Symmetric, no curvature, ROM normal, no CVA tenderness  Lungs:     Clear to  auscultation bilaterally, respirations unlabored  Chest Wall:    No tenderness or deformity   Heart:    Regular rate and rhythm, S1 and S2 normal, no murmur, rub   or gallop  Breast Exam:    Deferred to GYN  Abdomen:     Soft, non-tender, bowel sounds active all four quadrants,    no masses, no organomegaly  Genitalia:    Deferred to GYN  Rectal:    Extremities:   Extremities normal, atraumatic, no cyanosis or edema  Pulses:   2+ and symmetric all extremities  Skin:   Skin color, texture, turgor normal, no rashes or lesions  Lymph nodes:   Cervical, supraclavicular, and axillary nodes normal  Neurologic:   CNII-XII intact, normal strength, sensation and reflexes    throughout          Assessment & Plan:

## 2023-04-21 NOTE — Assessment & Plan Note (Signed)
Pt's PE WNL w/ exception of BMI.  UTD on colonoscopy, immunizations.  Mammo later today.  Check labs.  Anticipatory guidance provided.

## 2023-04-21 NOTE — Patient Instructions (Signed)
Follow up in 6 months to recheck weight loss progress and cholesterol We'll notify you of your lab results and make any changes if needed Continue to work on healthy diet and regular exercise- you can do it!! Call with any questions or concerns Stay Safe!!  Stay Healthy! Happy New Year!!

## 2023-04-22 ENCOUNTER — Telehealth: Payer: Self-pay

## 2023-04-22 NOTE — Telephone Encounter (Signed)
-----   Message from Neena Rhymes sent at 04/21/2023  3:28 PM EST ----- Labs look good!  No changes at this time

## 2023-04-22 NOTE — Telephone Encounter (Signed)
Patient is aware of her lab results. She had a question about continuing her OTC vitamin d supplement and I let her know that was ok to still take and if her Vitamin D ever became too high Dr. Beverely Low would advise otherwise. I have updated her med list, patient stated she was not taking the once a week Vit. D but is taking daily 1,000 mg

## 2023-04-24 ENCOUNTER — Encounter: Payer: Self-pay | Admitting: Family Medicine

## 2023-04-26 LAB — HM PAP SMEAR: HPV, high-risk: NEGATIVE

## 2023-05-20 DIAGNOSIS — H5211 Myopia, right eye: Secondary | ICD-10-CM | POA: Diagnosis not present

## 2023-05-20 DIAGNOSIS — H10413 Chronic giant papillary conjunctivitis, bilateral: Secondary | ICD-10-CM | POA: Diagnosis not present

## 2023-07-02 DIAGNOSIS — H10413 Chronic giant papillary conjunctivitis, bilateral: Secondary | ICD-10-CM | POA: Diagnosis not present

## 2023-07-05 ENCOUNTER — Other Ambulatory Visit: Payer: Self-pay | Admitting: Family Medicine

## 2023-09-18 DIAGNOSIS — L814 Other melanin hyperpigmentation: Secondary | ICD-10-CM | POA: Diagnosis not present

## 2023-09-18 DIAGNOSIS — L821 Other seborrheic keratosis: Secondary | ICD-10-CM | POA: Diagnosis not present

## 2023-09-18 DIAGNOSIS — Z08 Encounter for follow-up examination after completed treatment for malignant neoplasm: Secondary | ICD-10-CM | POA: Diagnosis not present

## 2023-09-18 DIAGNOSIS — D225 Melanocytic nevi of trunk: Secondary | ICD-10-CM | POA: Diagnosis not present

## 2023-10-23 ENCOUNTER — Ambulatory Visit: Payer: BC Managed Care – PPO | Admitting: Family Medicine

## 2023-11-06 DIAGNOSIS — L538 Other specified erythematous conditions: Secondary | ICD-10-CM | POA: Diagnosis not present

## 2023-11-06 DIAGNOSIS — L820 Inflamed seborrheic keratosis: Secondary | ICD-10-CM | POA: Diagnosis not present

## 2023-11-06 DIAGNOSIS — L570 Actinic keratosis: Secondary | ICD-10-CM | POA: Diagnosis not present

## 2023-11-06 DIAGNOSIS — Z789 Other specified health status: Secondary | ICD-10-CM | POA: Diagnosis not present

## 2023-11-06 DIAGNOSIS — D485 Neoplasm of uncertain behavior of skin: Secondary | ICD-10-CM | POA: Diagnosis not present

## 2023-11-18 DIAGNOSIS — Z8673 Personal history of transient ischemic attack (TIA), and cerebral infarction without residual deficits: Secondary | ICD-10-CM | POA: Insufficient documentation

## 2023-11-19 ENCOUNTER — Encounter: Payer: Self-pay | Admitting: Family Medicine

## 2023-11-19 ENCOUNTER — Ambulatory Visit (INDEPENDENT_AMBULATORY_CARE_PROVIDER_SITE_OTHER): Admitting: Family Medicine

## 2023-11-19 VITALS — BP 114/88 | HR 74 | Temp 97.9°F | Resp 18 | Ht 70.0 in | Wt 211.2 lb

## 2023-11-19 DIAGNOSIS — Z23 Encounter for immunization: Secondary | ICD-10-CM

## 2023-11-19 DIAGNOSIS — E669 Obesity, unspecified: Secondary | ICD-10-CM | POA: Diagnosis not present

## 2023-11-19 DIAGNOSIS — E785 Hyperlipidemia, unspecified: Secondary | ICD-10-CM | POA: Diagnosis not present

## 2023-11-19 LAB — LIPID PANEL
Cholesterol: 165 mg/dL (ref 0–200)
HDL: 58.9 mg/dL (ref >39.00)
LDL Cholesterol: 80 mg/dL (ref 0–99)
NonHDL: 105.62
Total CHOL/HDL Ratio: 3
Triglycerides: 126 mg/dL (ref 0.0–149.0)
VLDL: 25.2 mg/dL (ref 0.0–40.0)

## 2023-11-19 LAB — BASIC METABOLIC PANEL WITH GFR
BUN: 11 mg/dL (ref 6–23)
CO2: 26 meq/L (ref 19–32)
Calcium: 9.2 mg/dL (ref 8.4–10.5)
Chloride: 102 meq/L (ref 96–112)
Creatinine, Ser: 0.54 mg/dL (ref 0.40–1.20)
GFR: 107.58 mL/min (ref >60.00)
Glucose, Bld: 97 mg/dL (ref 70–99)
Potassium: 4.5 meq/L (ref 3.5–5.1)
Sodium: 136 meq/L (ref 135–145)

## 2023-11-19 LAB — CBC WITH DIFFERENTIAL/PLATELET
Basophils Absolute: 0.1 K/uL (ref 0.0–0.1)
Basophils Relative: 0.9 % (ref 0.0–3.0)
Eosinophils Absolute: 0.1 K/uL (ref 0.0–0.7)
Eosinophils Relative: 2 % (ref 0.0–5.0)
HCT: 42.4 % (ref 36.0–46.0)
Hemoglobin: 14 g/dL (ref 12.0–15.0)
Lymphocytes Relative: 25.5 % (ref 12.0–46.0)
Lymphs Abs: 1.7 K/uL (ref 0.7–4.0)
MCHC: 33.1 g/dL (ref 30.0–36.0)
MCV: 92.1 fl (ref 78.0–100.0)
Monocytes Absolute: 0.5 K/uL (ref 0.1–1.0)
Monocytes Relative: 7.1 % (ref 3.0–12.0)
Neutro Abs: 4.2 K/uL (ref 1.4–7.7)
Neutrophils Relative %: 64.5 % (ref 43.0–77.0)
RBC: 4.6 Mil/uL (ref 3.87–5.11)
RDW: 13.2 % (ref 11.5–15.5)
WBC: 6.5 K/uL (ref 4.0–10.5)

## 2023-11-19 LAB — HEPATIC FUNCTION PANEL
ALT: 22 U/L (ref 0–35)
AST: 23 U/L (ref 0–37)
Albumin: 4.6 g/dL (ref 3.5–5.2)
Alkaline Phosphatase: 42 U/L (ref 39–117)
Bilirubin, Direct: 0.2 mg/dL (ref 0.0–0.3)
Total Bilirubin: 1.3 mg/dL — ABNORMAL HIGH (ref 0.2–1.2)
Total Protein: 7 g/dL (ref 6.0–8.3)

## 2023-11-19 LAB — TSH: TSH: 2.12 u[IU]/mL (ref 0.35–5.50)

## 2023-11-19 NOTE — Assessment & Plan Note (Signed)
 Improving!  Down 11 lbs since last visit.  Is attempting to make life changes with diet and exercise- applauded her efforts.  Will continue to follow.

## 2023-11-19 NOTE — Addendum Note (Signed)
 Addended by: HONOR BERN A on: 11/19/2023 08:58 AM   Modules accepted: Orders

## 2023-11-19 NOTE — Progress Notes (Signed)
   Subjective:    Patient ID: Angie Martin, female    DOB: [birth year], 48 y.o.   MRN: [id number]  HPI Hyperlipidemia- chronic problem, on Crestor  20mg  daily.  Denies CP, abd pain, N/V  Obesity- ongoing issue, down 11 lbs since last visit.  Is trying to address weight and overall health.   Review of Systems For ROS see HPI     Objective:   Physical Exam Vitals reviewed.  Constitutional:      General: She is not in acute distress.    Appearance: Normal appearance. She is well-developed. She is not ill-appearing.  HENT:     Head: Normocephalic and atraumatic.  Eyes:     Conjunctiva/sclera: Conjunctivae normal.     Pupils: Pupils are equal, round, and reactive to light.  Neck:     Thyroid : No thyromegaly.  Cardiovascular:     Rate and Rhythm: Normal rate and regular rhythm.     Pulses: Normal pulses.     Heart sounds: Normal heart sounds. No murmur heard. Pulmonary:     Effort: Pulmonary effort is normal. No respiratory distress.     Breath sounds: Normal breath sounds.  Abdominal:     General: There is no distension.     Palpations: Abdomen is soft.     Tenderness: There is no abdominal tenderness.  Musculoskeletal:     Cervical back: Normal range of motion and neck supple.     Right lower leg: No edema.     Left lower leg: No edema.  Lymphadenopathy:     Cervical: No cervical adenopathy.  Skin:    General: Skin is warm and dry.  Neurological:     General: No focal deficit present.     Mental Status: She is alert and oriented to person, place, and time.  Psychiatric:        Mood and Affect: Mood normal.        Behavior: Behavior normal.        Thought Content: Thought content normal.           Assessment & Plan:

## 2023-11-19 NOTE — Assessment & Plan Note (Signed)
 Chronic problem.  On Crestor 20mg  daily w/o difficulty.  Check labs.  Adjust meds prn

## 2023-11-19 NOTE — Patient Instructions (Signed)
Schedule your complete physical in 6 months We'll notify you of your lab results and make any changes if needed Continue to work on healthy diet and regular exercise- you can do it! Call with any questions or concerns Stay Safe!  Stay Healthy! 

## 2023-11-20 ENCOUNTER — Ambulatory Visit: Payer: Self-pay | Admitting: Family Medicine

## 2023-11-24 NOTE — Telephone Encounter (Signed)
 Patient has questions regarding High HDL and Bilirubin and if she should be concerned or not regarding these levels?

## 2024-01-01 ENCOUNTER — Other Ambulatory Visit: Payer: Self-pay | Admitting: Family Medicine

## 2024-02-19 ENCOUNTER — Ambulatory Visit: Admitting: Family Medicine

## 2024-02-19 ENCOUNTER — Encounter: Payer: Self-pay | Admitting: Family Medicine

## 2024-02-19 VITALS — BP 124/78 | HR 75 | Temp 98.1°F | Ht 70.0 in | Wt 212.0 lb

## 2024-02-19 DIAGNOSIS — F902 Attention-deficit hyperactivity disorder, combined type: Secondary | ICD-10-CM

## 2024-02-19 MED ORDER — BUPROPION HCL 75 MG PO TABS: 75.0000 mg | ORAL_TABLET | Freq: Two times a day (BID) | ORAL | 3 refills | Status: DC

## 2024-02-19 NOTE — Progress Notes (Signed)
   Subjective:    Patient ID: Angie Martin, female    DOB: [birth year], 48 y.o.   MRN: [id number]  HPI Possible ADHD- took an Adderall that was given to her and 'it was amazing'.  She works 15 hrs most days but has a hard time focusing.  Has a hard time being present in a conversation.  Pt reports she has felt this way her entire life.  Has never been evaluated or dx'd w/ ADHD.   Review of Systems For ROS see HPI     Objective:   Physical Exam Vitals reviewed.  Constitutional:      General: She is not in acute distress.    Appearance: Normal appearance. She is not ill-appearing.  HENT:     Head: Normocephalic and atraumatic.  Cardiovascular:     Rate and Rhythm: Normal rate and regular rhythm.  Pulmonary:     Effort: Pulmonary effort is normal. No respiratory distress.  Skin:    General: Skin is warm and dry.  Neurological:     General: No focal deficit present.     Mental Status: She is alert and oriented to person, place, and time.  Psychiatric:        Mood and Affect: Mood normal.        Behavior: Behavior normal.        Thought Content: Thought content normal.           Assessment & Plan:  ADHD- new.  Not officially dx'd but pt w/ lifelong sxs that could be consistent w/ dx.  Reports she took Adderall and it was 'life changing'.  Discussed that we can't start a controlled substance w/o an official dx but we could start Wellbutrin  and monitor for improvement.  Pt expressed understanding and is in agreement w/ plan.

## 2024-02-19 NOTE — Patient Instructions (Signed)
 Follow up in 4-6 weeks to recheck attention/mood START the Wellbutrin  twice daily It may take 1-2 weeks to notice a change Call with any questions or concerns Stay Safe!  Stay Healthy! Happy Holidays!!

## 2024-03-15 ENCOUNTER — Other Ambulatory Visit: Payer: Self-pay | Admitting: Family Medicine

## 2024-03-29 ENCOUNTER — Ambulatory Visit: Admitting: Family Medicine

## 2024-03-29 ENCOUNTER — Encounter: Payer: Self-pay | Admitting: Family Medicine

## 2024-03-29 VITALS — BP 122/76 | HR 78 | Ht 70.0 in | Wt 220.0 lb

## 2024-03-29 DIAGNOSIS — F902 Attention-deficit hyperactivity disorder, combined type: Secondary | ICD-10-CM | POA: Insufficient documentation

## 2024-03-29 MED ORDER — BUPROPION HCL ER (XL) 150 MG PO TB24: 150.0000 mg | ORAL_TABLET | Freq: Every day | ORAL | 1 refills | Status: AC

## 2024-03-29 NOTE — Patient Instructions (Addendum)
 Follow up as scheduled Continue the Wellbutrin  twice daily until you get sick of the twice daily dosing and then switch to the extended release daily Keep up the good work!  You look great! Call with any questions or concerns Stay Safe!  Stay Healthy! Happy New Year!!!

## 2024-03-29 NOTE — Assessment & Plan Note (Signed)
 Pt's mood and attention is better since starting Wellbutrin .  She really enjoys how she feels on the medication.  The only issue is taking the medication twice daily.  Will increase to 150mg  XL daily.  Pt expressed understanding and is in agreement w/ plan.

## 2024-03-29 NOTE — Progress Notes (Signed)
" ° °  Subjective:    Patient ID: Angie Martin, female    DOB: [birth year], 48 y.o.   MRN: [id number]  HPI ADHD- at last visit was started on Wellbutrin .  'i think it's great'.  No longer having tension or tightness in the chest.  Slower to anger.  Focus is not as good as it was on Adderall but better.  Does forget to take PM dose.  Wondering if there's a way around it.   Review of Systems For ROS see HPI     Objective:   Physical Exam Vitals reviewed.  Constitutional:      General: She is not in acute distress.    Appearance: Normal appearance. She is not ill-appearing.  HENT:     Head: Normocephalic and atraumatic.  Cardiovascular:     Rate and Rhythm: Normal rate and regular rhythm.  Pulmonary:     Effort: Pulmonary effort is normal. No respiratory distress.  Skin:    General: Skin is warm and dry.  Neurological:     General: No focal deficit present.     Mental Status: She is alert and oriented to person, place, and time.  Psychiatric:        Mood and Affect: Mood normal.        Behavior: Behavior normal.        Thought Content: Thought content normal.           Assessment & Plan:    "

## 2024-04-29 ENCOUNTER — Ambulatory Visit (INDEPENDENT_AMBULATORY_CARE_PROVIDER_SITE_OTHER)

## 2024-04-29 ENCOUNTER — Telehealth: Payer: Self-pay | Admitting: *Deleted

## 2024-04-29 ENCOUNTER — Ambulatory Visit: Admitting: Family Medicine

## 2024-04-29 ENCOUNTER — Encounter: Payer: Self-pay | Admitting: Family Medicine

## 2024-04-29 ENCOUNTER — Ambulatory Visit: Payer: Self-pay | Admitting: Family Medicine

## 2024-04-29 VITALS — BP 132/86 | HR 81 | Ht 70.0 in | Wt 218.0 lb

## 2024-04-29 DIAGNOSIS — Z0000 Encounter for general adult medical examination without abnormal findings: Secondary | ICD-10-CM

## 2024-04-29 DIAGNOSIS — Z23 Encounter for immunization: Secondary | ICD-10-CM | POA: Diagnosis not present

## 2024-04-29 DIAGNOSIS — E785 Hyperlipidemia, unspecified: Secondary | ICD-10-CM | POA: Diagnosis not present

## 2024-04-29 DIAGNOSIS — R7309 Other abnormal glucose: Secondary | ICD-10-CM

## 2024-04-29 DIAGNOSIS — E559 Vitamin D deficiency, unspecified: Secondary | ICD-10-CM | POA: Diagnosis not present

## 2024-04-29 LAB — BASIC METABOLIC PANEL WITH GFR
BUN: 14 mg/dL (ref 6–23)
CO2: 30 meq/L (ref 19–32)
Calcium: 10.2 mg/dL (ref 8.4–10.5)
Chloride: 101 meq/L (ref 96–112)
Creatinine, Ser: 0.63 mg/dL (ref 0.40–1.20)
GFR: 103.33 mL/min
Glucose, Bld: 106 mg/dL — ABNORMAL HIGH (ref 70–99)
Potassium: 5 meq/L (ref 3.5–5.1)
Sodium: 138 meq/L (ref 135–145)

## 2024-04-29 LAB — HEPATIC FUNCTION PANEL
ALT: 24 U/L (ref 3–35)
AST: 20 U/L (ref 5–37)
Albumin: 5.1 g/dL (ref 3.5–5.2)
Alkaline Phosphatase: 49 U/L (ref 39–117)
Bilirubin, Direct: 0.2 mg/dL (ref 0.1–0.3)
Total Bilirubin: 1.2 mg/dL (ref 0.2–1.2)
Total Protein: 7.6 g/dL (ref 6.0–8.3)

## 2024-04-29 LAB — CBC WITH DIFFERENTIAL/PLATELET
Basophils Absolute: 0 10*3/uL (ref 0.0–0.1)
Basophils Relative: 0.5 % (ref 0.0–3.0)
Eosinophils Absolute: 0.1 10*3/uL (ref 0.0–0.7)
Eosinophils Relative: 1.4 % (ref 0.0–5.0)
HCT: 44.6 % (ref 36.0–46.0)
Hemoglobin: 14.9 g/dL (ref 12.0–15.0)
Lymphocytes Relative: 28.4 % (ref 12.0–46.0)
Lymphs Abs: 1.8 10*3/uL (ref 0.7–4.0)
MCHC: 33.5 g/dL (ref 30.0–36.0)
MCV: 90.9 fl (ref 78.0–100.0)
Monocytes Absolute: 0.5 10*3/uL (ref 0.1–1.0)
Monocytes Relative: 7.3 % (ref 3.0–12.0)
Neutro Abs: 4 10*3/uL (ref 1.4–7.7)
Neutrophils Relative %: 62.4 % (ref 43.0–77.0)
Platelets: 234 10*3/uL (ref 150.0–400.0)
RBC: 4.9 Mil/uL (ref 3.87–5.11)
RDW: 13.1 % (ref 11.5–15.5)
WBC: 6.4 10*3/uL (ref 4.0–10.5)

## 2024-04-29 LAB — LIPID PANEL
Cholesterol: 196 mg/dL (ref 28–200)
HDL: 57.8 mg/dL
LDL Cholesterol: 100 mg/dL — ABNORMAL HIGH (ref 10–99)
NonHDL: 138.18
Total CHOL/HDL Ratio: 3
Triglycerides: 193 mg/dL — ABNORMAL HIGH (ref 10.0–149.0)
VLDL: 38.6 mg/dL (ref 0.0–40.0)

## 2024-04-29 LAB — TSH: TSH: 3.32 u[IU]/mL (ref 0.35–5.50)

## 2024-04-29 LAB — VITAMIN D 25 HYDROXY (VIT D DEFICIENCY, FRACTURES): VITD: 32.48 ng/mL (ref 30.00–100.00)

## 2024-04-29 NOTE — Progress Notes (Signed)
 Patient is in office today for a nurse visit for Immunization. Patient Injection was given in the  Left deltoid. Patient tolerated injection well.

## 2024-04-29 NOTE — Telephone Encounter (Signed)
 I called patient to speak with her about her visit and interaction in the office today after receiving much feedback from office staff.  Pt states that she was in the middle of her workday. I offered to leave my number and she asked me to email it to her. I told her I could send a mychart message and then she stated that she does not like to check her mychart like that.  Next she told me that she was driving and on another call and did not know when she could get back to me but she did agree that her visit did not go well today.  I would love the opportunity to speak with her if she calls back.  Thank you,  Sharene ORN, [facility]) Clinical Water Engineer [facility] Phone: [phone] Fax: [phone]

## 2024-04-29 NOTE — Progress Notes (Signed)
" ° °  Subjective:    Patient ID: Angie Martin, female    DOB: 04/21/1975, 49 y.o.   MRN: [id number]  HPI CPE- UTD on mammo, colonoscopy, Tdap.  Declines PNA.  Got shingles today.  Health Maintenance  Topic Date Due   COVID-19 Vaccine (3 - 2025-26 season) 11/29/2024 (Originally 12/01/2023)   Pneumococcal Vaccine: 50+ Years (1 of 1 - PCV) 04/28/2025 (Originally 10/06/2023)   Hepatitis B Vaccines 19-59 Average Risk (1 of 3 - 19+ 3-dose series) 04/29/2025 (Originally 10/05/1992)   Mammogram  04/20/2025   DTaP/Tdap/Td (4 - Td or Tdap) 01/27/2029   Colonoscopy  01/23/2031   Influenza Vaccine  Completed   HPV VACCINES (No Doses Required) Completed   Hepatitis C Screening  Completed   HIV Screening  Completed   Zoster Vaccines- Shingrix   Completed   Meningococcal B Vaccine  Aged Out    Patient Care Team    Relationship Specialty Notifications Start End  Mahlon Charo BRAVO, MD PCP - General Family Medicine  01/28/19   Sarrah Browning, MD Consulting Physician Obstetrics and Gynecology  01/28/19       Review of Systems Patient reports no vision/hearing changes, adenopathy,fever, weight change,  persistant/recurrent hoarseness , swallowing issues, chest pain, palpitations, edema, persistant/recurrent cough, hemoptysis, dyspnea (rest/exertional/paroxysmal nocturnal), gastrointestinal bleeding (melena, rectal bleeding), abdominal pain, significant heartburn, bowel changes, GU symptoms (dysuria, hematuria, incontinence), Gyn symptoms (abnormal  bleeding, pain),  syncope, focal weakness, memory loss, numbness & tingling, skin/hair/nail changes, abnormal bruising or bleeding, anxiety, or depression.     Objective:   Physical Exam General Appearance:    Alert, cooperative, no distress, appears stated age  Head:    Normocephalic, without obvious abnormality, atraumatic  Eyes:    PERRL, conjunctiva/corneas clear, EOM's intact both eyes  Ears:    Normal TM's and external ear canals, both ears  Nose:   Nares  normal, septum midline, mucosa normal, no drainage    or sinus tenderness  Throat:   Lips, mucosa, and tongue normal; teeth and gums normal  Neck:   Supple, symmetrical, trachea midline, no adenopathy;    Thyroid : no enlargement/tenderness/nodules  Back:     Symmetric, no curvature, ROM normal, no CVA tenderness  Lungs:     Clear to auscultation bilaterally, respirations unlabored  Chest Wall:    No tenderness or deformity   Heart:    Regular rate and rhythm, S1 and S2 normal, no murmur, rub   or gallop  Breast Exam:    Deferred to GYN  Abdomen:     Soft, non-tender, bowel sounds active all four quadrants,    no masses, no organomegaly  Genitalia:    Deferred to GYN  Rectal:    Extremities:   Extremities normal, atraumatic, no cyanosis or edema  Pulses:   2+ and symmetric all extremities  Skin:   Skin color, texture, turgor normal, no rashes or lesions  Lymph nodes:   Cervical, supraclavicular, and axillary nodes normal  Neurologic:   CNII-XII intact, normal strength, sensation and reflexes    throughout          Assessment & Plan:    "

## 2024-04-29 NOTE — Patient Instructions (Signed)
Follow up in 6 months to recheck cholesterol We'll notify you of your lab results and make any changes if needed Keep up the good work on healthy diet and regular exercise- you're doing great!! Call with any questions or concerns Stay Safe!  Stay Healthy! Happy Valentine's Day!!

## 2024-04-30 ENCOUNTER — Ambulatory Visit (INDEPENDENT_AMBULATORY_CARE_PROVIDER_SITE_OTHER)

## 2024-04-30 ENCOUNTER — Ambulatory Visit: Payer: Self-pay | Admitting: Family Medicine

## 2024-04-30 DIAGNOSIS — R7309 Other abnormal glucose: Secondary | ICD-10-CM

## 2024-04-30 LAB — HEMOGLOBIN A1C: Hgb A1c MFr Bld: 5.7 % (ref 4.6–6.5)

## 2024-05-01 NOTE — Assessment & Plan Note (Signed)
 Pt's PE WNL w/ exception of BMI.  UTD on mammo, colonoscopy, Tdap.  Got shingles today.  Check labs.  Anticipatory guidance provided.

## 2024-05-26 ENCOUNTER — Encounter: Admitting: Family Medicine
